# Patient Record
Sex: Female | Born: 1960 | Race: White | Hispanic: Yes | Marital: Single | Smoking: Current every day smoker
Health system: Southern US, Community
[De-identification: ages and names within clinical notes are randomized; demographics above are authoritative.]

## PROBLEM LIST (undated history)

## (undated) DIAGNOSIS — E7800 Pure hypercholesterolemia, unspecified: Secondary | ICD-10-CM

## (undated) DIAGNOSIS — E079 Disorder of thyroid, unspecified: Secondary | ICD-10-CM

## (undated) HISTORY — PX: ABDOMINAL HYSTERECTOMY: SHX81

## (undated) HISTORY — PX: TOTAL ABDOMINAL HYSTERECTOMY: SHX209

---

## 2020-12-31 ENCOUNTER — Other Ambulatory Visit: Payer: Self-pay

## 2020-12-31 ENCOUNTER — Emergency Department (HOSPITAL_COMMUNITY): Payer: 59

## 2020-12-31 ENCOUNTER — Encounter (HOSPITAL_COMMUNITY): Payer: Self-pay | Admitting: *Deleted

## 2020-12-31 ENCOUNTER — Observation Stay (HOSPITAL_COMMUNITY)
Admission: EM | Admit: 2020-12-31 | Discharge: 2021-01-03 | Disposition: A | Discharge status: Home / Self Care | Payer: 59 | Attending: Internal Medicine | Admitting: Internal Medicine

## 2020-12-31 DIAGNOSIS — F17200 Nicotine dependence, unspecified, uncomplicated: Secondary | ICD-10-CM | POA: Diagnosis not present

## 2020-12-31 DIAGNOSIS — N12 Tubulo-interstitial nephritis, not specified as acute or chronic: Secondary | ICD-10-CM | POA: Insufficient documentation

## 2020-12-31 DIAGNOSIS — M791 Myalgia, unspecified site: Secondary | ICD-10-CM | POA: Diagnosis not present

## 2020-12-31 DIAGNOSIS — E039 Hypothyroidism, unspecified: Secondary | ICD-10-CM | POA: Insufficient documentation

## 2020-12-31 DIAGNOSIS — A084 Viral intestinal infection, unspecified: Secondary | ICD-10-CM | POA: Diagnosis not present

## 2020-12-31 DIAGNOSIS — Z20822 Contact with and (suspected) exposure to covid-19: Secondary | ICD-10-CM | POA: Insufficient documentation

## 2020-12-31 DIAGNOSIS — R7401 Elevation of levels of liver transaminase levels: Secondary | ICD-10-CM | POA: Diagnosis not present

## 2020-12-31 DIAGNOSIS — R109 Unspecified abdominal pain: Secondary | ICD-10-CM | POA: Diagnosis present

## 2020-12-31 DIAGNOSIS — R509 Fever, unspecified: Secondary | ICD-10-CM | POA: Diagnosis present

## 2020-12-31 DIAGNOSIS — R112 Nausea with vomiting, unspecified: Secondary | ICD-10-CM | POA: Diagnosis present

## 2020-12-31 HISTORY — DX: Disorder of thyroid, unspecified: E07.9

## 2020-12-31 LAB — CBC WITH DIFFERENTIAL/PLATELET
Abs Immature Granulocytes: 0.01 10*3/uL (ref 0.00–0.07)
Basophils Absolute: 0 10*3/uL (ref 0.0–0.1)
Basophils Relative: 1 %
Eosinophils Absolute: 0 10*3/uL (ref 0.0–0.5)
Eosinophils Relative: 0 %
HCT: 45.4 % (ref 36.0–46.0)
Hemoglobin: 15.2 g/dL — ABNORMAL HIGH (ref 12.0–15.0)
Immature Granulocytes: 0 %
Lymphocytes Relative: 23 %
Lymphs Abs: 0.6 10*3/uL — ABNORMAL LOW (ref 0.7–4.0)
MCH: 31.5 pg (ref 26.0–34.0)
MCHC: 33.5 g/dL (ref 30.0–36.0)
MCV: 94.2 fL (ref 80.0–100.0)
Monocytes Absolute: 0.1 10*3/uL (ref 0.1–1.0)
Monocytes Relative: 3 %
Neutro Abs: 2 10*3/uL (ref 1.7–7.7)
Neutrophils Relative %: 73 %
Platelets: UNDETERMINED 10*3/uL (ref 150–400)
RBC: 4.82 MIL/uL (ref 3.87–5.11)
RDW: 13.6 % (ref 11.5–15.5)
WBC: 2.8 10*3/uL — ABNORMAL LOW (ref 4.0–10.5)
nRBC: 0 % (ref 0.0–0.2)

## 2020-12-31 LAB — PROTIME-INR
INR: 1 (ref 0.8–1.2)
Prothrombin Time: 13.2 seconds (ref 11.4–15.2)

## 2020-12-31 LAB — COMPREHENSIVE METABOLIC PANEL
ALT: 165 U/L — ABNORMAL HIGH (ref 0–44)
AST: 142 U/L — ABNORMAL HIGH (ref 15–41)
Albumin: 3.5 g/dL (ref 3.5–5.0)
Alkaline Phosphatase: 180 U/L — ABNORMAL HIGH (ref 38–126)
Anion gap: 10 (ref 5–15)
BUN: <5 mg/dL — ABNORMAL LOW (ref 6–20)
CO2: 26 mmol/L (ref 22–32)
Calcium: 8.5 mg/dL — ABNORMAL LOW (ref 8.9–10.3)
Chloride: 99 mmol/L (ref 98–111)
Creatinine, Ser: 0.83 mg/dL (ref 0.44–1.00)
GFR, Estimated: >60 mL/min (ref >60)
Glucose, Bld: 98 mg/dL (ref 70–99)
Potassium: 3.9 mmol/L (ref 3.5–5.1)
Sodium: 135 mmol/L (ref 135–145)
Total Bilirubin: 1.8 mg/dL — ABNORMAL HIGH (ref 0.3–1.2)
Total Protein: 6.5 g/dL (ref 6.5–8.1)

## 2020-12-31 LAB — URINALYSIS, ROUTINE W REFLEX MICROSCOPIC
Bilirubin Urine: NEGATIVE
Glucose, UA: NEGATIVE mg/dL
Ketones, ur: NEGATIVE mg/dL
Nitrite: POSITIVE — AB
Protein, ur: NEGATIVE mg/dL
Specific Gravity, Urine: 1.002 — ABNORMAL LOW (ref 1.005–1.030)
pH: 7 (ref 5.0–8.0)

## 2020-12-31 LAB — LACTIC ACID, PLASMA: Lactic Acid, Venous: 1.1 mmol/L (ref 0.5–1.9)

## 2020-12-31 LAB — RESP PANEL BY RT-PCR (FLU A&B, COVID) ARPGX2
Influenza A by PCR: NEGATIVE
Influenza B by PCR: NEGATIVE
SARS Coronavirus 2 by RT PCR: NEGATIVE

## 2020-12-31 LAB — APTT: aPTT: 28 seconds (ref 24–36)

## 2020-12-31 LAB — I-STAT BETA HCG BLOOD, ED (MC, WL, AP ONLY): I-stat hCG, quantitative: 10.1 m[IU]/mL — ABNORMAL HIGH (ref <5)

## 2020-12-31 MED ORDER — LACTATED RINGERS IV BOLUS (SEPSIS)
1000.0000 mL | Freq: Once | INTRAVENOUS | Status: AC
  Administered 2020-12-31: 1000 mL via INTRAVENOUS

## 2020-12-31 MED ORDER — ACETAMINOPHEN 325 MG PO TABS
650.0000 mg | ORAL_TABLET | Freq: Once | ORAL | Status: AC
  Administered 2020-12-31: 650 mg via ORAL
  Filled 2020-12-31: qty 2

## 2020-12-31 MED ORDER — IBUPROFEN 800 MG PO TABS
800.0000 mg | ORAL_TABLET | Freq: Once | ORAL | Status: AC
  Administered 2021-01-01: 800 mg via ORAL
  Filled 2020-12-31: qty 1

## 2020-12-31 MED ORDER — METRONIDAZOLE 500 MG/100ML IV SOLN
500.0000 mg | Freq: Once | INTRAVENOUS | Status: AC
  Administered 2020-12-31: 500 mg via INTRAVENOUS
  Filled 2020-12-31: qty 100

## 2020-12-31 MED ORDER — LACTATED RINGERS IV SOLN: INTRAVENOUS | Status: AC

## 2020-12-31 MED ORDER — SODIUM CHLORIDE 0.9 % IV SOLN
2.0000 g | Freq: Once | INTRAVENOUS | Status: AC
  Administered 2020-12-31: 2 g via INTRAVENOUS
  Filled 2020-12-31: qty 20

## 2020-12-31 MED ORDER — ONDANSETRON 4 MG PO TBDP
4.0000 mg | ORAL_TABLET | Freq: Once | ORAL | Status: AC
  Administered 2020-12-31: 4 mg via ORAL
  Filled 2020-12-31: qty 1

## 2020-12-31 MED ORDER — LACTATED RINGERS IV BOLUS (SEPSIS)
250.0000 mL | Freq: Once | INTRAVENOUS | Status: AC
  Administered 2020-12-31: 250 mL via INTRAVENOUS

## 2020-12-31 NOTE — ED Provider Notes (Signed)
[facility] Provider Note   CSN: [id number] Arrival date & time: 12/31/20  1648     History Chief Complaint  Patient presents with  . Fever    Natasha Roberts is a 60 y.o. female.  The history is provided by the patient. The history is limited by a language barrier. A language interpreter was used.  Fever    60 year old female presenting with cold symptoms.  Patient is Spanish-speaking, history obtained through language interpreter.  Patient report for the past 4 days she has had fever, chills, headache, abdominal pain, nausea, vomiting, some loose stools, back pain, body aches and overall not feeling well.  Symptoms are moderate in severity and not improved despite taking home medication including Tylenol and ibuprofen.  She endorsed occasional cough and some mild sore throat.  She endorsed decrease in appetite having been eating or drinking much.  She denies any urinary symptoms no shortness of breath no neck stiffness.  No recent sick contact.  Patient has been fully vaccinated for COVID-19.  Patient denies alcohol or tobacco abuse.  Past Medical History:  Diagnosis Date  . Thyroid disease     There are no problems to display for this patient.   History reviewed. No pertinent surgical history.   OB History   No obstetric history on file.     History reviewed. No pertinent family history.  Social History   Tobacco Use  . Smoking status: Current Some Day Smoker  Substance Use Topics  . Alcohol use: Never  . Drug use: Never    Home Medications Prior to Admission medications   Not on File    Allergies    Patient has no known allergies.  Review of Systems   Review of Systems  Constitutional: Positive for fever.  All other systems reviewed and are negative.   Physical Exam Updated Vital Signs BP (!) 165/101 (BP Location: Right Arm)   Pulse (!) 152   Temp (!) 103.2 F (39.6 C) (Oral)   Resp (!) 24   SpO2 96%    Physical Exam Vitals and nursing note reviewed.  Constitutional:      General: She is not in acute distress.    Appearance: She is well-developed.  HENT:     Head: Atraumatic.  Eyes:     Conjunctiva/sclera: Conjunctivae normal.  Neck:     Comments: No nuchal rigidity Cardiovascular:     Rate and Rhythm: Tachycardia present.     Pulses: Normal pulses.     Heart sounds: Normal heart sounds.  Pulmonary:     Effort: Pulmonary effort is normal.     Breath sounds: Normal breath sounds. No wheezing, rhonchi or rales.  Abdominal:     Palpations: Abdomen is soft.     Tenderness: There is abdominal tenderness (Tenderness left upper quadrant on palpation.  Negative Murphy sign, no pain at McBurney's point).  Musculoskeletal:     Cervical back: Neck supple. No rigidity.     Right lower leg: No edema.     Left lower leg: No edema.  Skin:    Findings: No rash.  Neurological:     Mental Status: She is alert and oriented to person, place, and time.     GCS: GCS eye subscore is 4. GCS verbal subscore is 5. GCS motor subscore is 6.     Cranial Nerves: Cranial nerves are intact.     Sensory: Sensation is intact.     Motor: Motor function is intact.  Psychiatric:        Mood and Affect: Mood normal.     ED Results / Procedures / Treatments   Labs (all labs ordered are listed, but only abnormal results are displayed) Labs Reviewed  COMPREHENSIVE METABOLIC PANEL - Abnormal; Notable for the following components:      Result Value   BUN <5 (*)    Calcium 8.5 (*)    AST 142 (*)    ALT 165 (*)    Alkaline Phosphatase 180 (*)    Total Bilirubin 1.8 (*)    All other components within normal limits  CBC WITH DIFFERENTIAL/PLATELET - Abnormal; Notable for the following components:   WBC 2.8 (*)    Hemoglobin 15.2 (*)    Lymphs Abs 0.6 (*)    All other components within normal limits  URINALYSIS, ROUTINE W REFLEX MICROSCOPIC - Abnormal; Notable for the following components:   Specific  Gravity, Urine 1.002 (*)    Hgb urine dipstick MODERATE (*)    Nitrite POSITIVE (*)    Leukocytes,Ua SMALL (*)    Bacteria, UA RARE (*)    All other components within normal limits  I-STAT BETA HCG BLOOD, ED ([facility], [facility], [facility] ONLY) - Abnormal; Notable for the following components:   I-stat hCG, quantitative 10.1 (*)    All other components within normal limits  RESP PANEL BY RT-PCR (FLU A&B, COVID) ARPGX2  CULTURE, BLOOD (ROUTINE X 2)  CULTURE, BLOOD (ROUTINE X 2)  URINE CULTURE  LACTIC ACID, PLASMA  PROTIME-INR  APTT  LACTIC ACID, PLASMA    EKG None  Radiology CT ABDOMEN PELVIS WO CONTRAST  Result Date: 12/31/2020 CLINICAL DATA:  Acute abdominal pain, nausea, and fever beginning today. EXAM: CT ABDOMEN AND PELVIS WITHOUT CONTRAST TECHNIQUE: Multidetector CT imaging of the abdomen and pelvis was performed following the standard protocol without IV contrast. COMPARISON:  None. FINDINGS: Lower chest: A few tiny pulmonary nodules are seen in the peripheral right middle lobe measuring up to 3 mm. Hepatobiliary: No mass visualized on this unenhanced exam. Gallbladder is unremarkable. No evidence of biliary ductal dilatation. Pancreas: No mass or inflammatory process visualized on this unenhanced exam. Spleen:  Within normal limits in size. Adrenals/Urinary tract: No evidence of urolithiasis or hydronephrosis. Unremarkable unopacified urinary bladder. Stomach/Bowel: No evidence of obstruction, inflammatory process, or abnormal fluid collections. Normal appendix visualized. Diverticulosis is seen predominately involving the right colon, however there is no evidence of diverticulitis. Vascular/Lymphatic: No pathologically enlarged lymph nodes identified. No evidence of abdominal aortic aneurysm. Reproductive: Prior hysterectomy noted. Adnexal regions are unremarkable in appearance. Other:  None. Musculoskeletal:  No suspicious bone lesions identified. IMPRESSION: No acute findings. Colonic diverticulosis,  without radiographic evidence of diverticulitis. Tiny right middle lobe pulmonary nodules, largest measuring 3 mm. No follow-up needed if patient is low-risk (and has no known or suspected primary neoplasm). Non-contrast chest CT can be considered in 12 months if patient is high-risk. This recommendation follows the consensus statement: Guidelines for Management of Incidental Pulmonary Nodules Detected on CT Images: From the Fleischner Society 2017; Radiology 2017; 284:228-243. Electronically Signed   By: Marlaine Hind M.D.   On: 12/31/2020 20:21   DG Chest Port 1 View  Result Date: 12/31/2020 CLINICAL DATA:  Questionable sepsis. EXAM: PORTABLE CHEST 1 VIEW COMPARISON:  None. FINDINGS: Multiple overlying radiopaque cardiac lead wires are noted. The heart size and mediastinal contours are within normal limits. Both lungs are clear. The visualized skeletal structures are unremarkable. IMPRESSION: No active disease. Electronically Signed  By: Virgina Norfolk M.D.   On: 12/31/2020 21:24    Procedures .Critical Care Performed by: Domenic Moras, PA-C Authorized by: Domenic Moras, PA-C   Critical care provider statement:    Critical care time (minutes):  40   Critical care was time spent personally by me on the following activities:  Discussions with consultants, evaluation of patient's response to treatment, examination of patient, ordering and performing treatments and interventions, ordering and review of laboratory studies, ordering and review of radiographic studies, pulse oximetry, re-evaluation of patient's condition, obtaining history from patient or surrogate and review of old charts     Medications Ordered in ED Medications  lactated ringers infusion (has no administration in time range)  ibuprofen (ADVIL) tablet 800 mg (has no administration in time range)  ondansetron (ZOFRAN-ODT) disintegrating tablet 4 mg (4 mg Oral Given 12/31/20 1705)  acetaminophen (TYLENOL) tablet 650 mg (650 mg Oral  Given 12/31/20 1704)  lactated ringers bolus 1,000 mL (0 mLs Intravenous Stopped 12/31/20 2257)    And  lactated ringers bolus 1,000 mL (0 mLs Intravenous Stopped 12/31/20 2257)    And  lactated ringers bolus 250 mL (250 mLs Intravenous New Bag/Given 12/31/20 2308)  cefTRIAXone (ROCEPHIN) 2 g in sodium chloride 0.9 % 100 mL IVPB (0 g Intravenous Stopped 12/31/20 2124)  metroNIDAZOLE (FLAGYL) IVPB 500 mg (0 mg Intravenous Stopped 12/31/20 2257)    ED Course  I have reviewed the triage vital signs and the nursing notes.  Pertinent labs & imaging results that were available during my care of the patient were reviewed by me and considered in my medical decision making (see chart for details).    MDM Rules/Calculators/A&P                          BP 135/83   Pulse (!) 120   Temp (!) 100.9 F (38.3 C) (Oral)   Resp 12   SpO2 94%   Final Clinical Impression(s) / ED Diagnoses Final diagnoses:  Pyelonephritis    Rx / DC Orders ED Discharge Orders    None     7:36 PM Patient here with fever chills body aches and upper abdominal pain.  Occasional cough.  Patient was found to be febrile with a temperature of 103.2 tachycardic with a heart rate of 152, and tachypneic with respiratory of 24.  Code sepsis initiated, since she has abdominal discomfort, will give Rocephin and metronidazole antibiotic as well as IV fluid.  COVID test today is negative.  10:07 PM Labs remarkable for transaminitis with AST 142, ALT 165, alk phos 180, total bili of 1.8.  Although patient does take Tylenol was not a significant amount suggest Tylenol induced transaminitis.  WBC is 2.80.  UA shows moderate hemoglobin and urine dipsticks as well as nitrite positive and small leukocyte.  COVID test negative.  Chest x-ray unremarkable.  An abdominal pelvis CT scan obtained showed no acute finding.  Evidence of diverticulosis without evidence of diverticulitis.  Incidentally a tiny right middle lobe pulmonary nodule  approximately 3 mm were noted.  However patient is at low risk for primary neoplasm therefore no follow-up is indicated.  In the setting of fever, tachycardia, possible UTI, patient will receive antibiotic.  Sepsis reassessment done.  At this time patient is not hypotensive.  Her temperature did improve after receiving Tylenol.  11:02 PM At this time patient reports she is feeling a bit better.  She denies nausea vomiting.  Patient from  that she does not take Tylenol regular basis.  Her transaminitis can be evaluated outpatient.  Patient voiced desire to go home.  Patient still is a bit tachycardic, will continue with fluid hydration and reassessment.  11:34 PM Patient resents that she can stay in the hospital for further treatment as she is still tachycardic however she is not hypotensive and she is not vomiting and request to be discharged.  She understands to return if her symptoms worsen.  Will discharge home with Bactrim as treatment for urinary tract infection.  On reassessment, she is still tachycardic and she felt she would prefer to stay in the hospital.  I felt this is the appropriate step, will consult for admission.  12:04 AM Pt sign out to oncoming team who will consult for admission.    Domenic Moras, PA-C 01/01/21 0006    Luna Fuse, MD 01/02/21 [phone]

## 2020-12-31 NOTE — ED Provider Notes (Incomplete)
[facility] Provider Note   CSN: [id number] Arrival date & time: 12/31/20  1648     History Chief Complaint  Patient presents with  . Fever    Natasha Roberts is a 60 y.o. female.  The history is provided by the patient. The history is limited by a language barrier. A language interpreter was used.  Fever    60 year old female presenting with cold symptoms.  Patient is Spanish-speaking, history obtained through language interpreter.  Patient report for the past 4 days she has had fever, chills, headache, abdominal pain, nausea, vomiting, some loose stools, back pain, body aches and overall not feeling well.  Symptoms are moderate in severity and not improved despite taking home medication including Tylenol and ibuprofen.  She endorsed occasional cough and some mild sore throat.  She endorsed decrease in appetite having been eating or drinking much.  She denies any urinary symptoms no shortness of breath no neck stiffness.  No recent sick contact.  Patient has been fully vaccinated for COVID-19.  Patient denies alcohol or tobacco abuse.  Past Medical History:  Diagnosis Date  . Thyroid disease     There are no problems to display for this patient.   History reviewed. No pertinent surgical history.   OB History   No obstetric history on file.     History reviewed. No pertinent family history.  Social History   Tobacco Use  . Smoking status: Current Some Day Smoker  Substance Use Topics  . Alcohol use: Never  . Drug use: Never    Home Medications Prior to Admission medications   Not on File    Allergies    Patient has no known allergies.  Review of Systems   Review of Systems  Constitutional: Positive for fever.  All other systems reviewed and are negative.   Physical Exam Updated Vital Signs BP (!) 165/101 (BP Location: Right Arm)   Pulse (!) 152   Temp (!) 103.2 F (39.6 C) (Oral)   Resp (!) 24   SpO2 96%    Physical Exam Vitals and nursing note reviewed.  Constitutional:      General: She is not in acute distress.    Appearance: She is well-developed.  HENT:     Head: Atraumatic.  Eyes:     Conjunctiva/sclera: Conjunctivae normal.  Neck:     Comments: No nuchal rigidity Cardiovascular:     Rate and Rhythm: Tachycardia present.     Pulses: Normal pulses.     Heart sounds: Normal heart sounds.  Pulmonary:     Effort: Pulmonary effort is normal.     Breath sounds: Normal breath sounds. No wheezing, rhonchi or rales.  Abdominal:     Palpations: Abdomen is soft.     Tenderness: There is abdominal tenderness (Tenderness left upper quadrant on palpation.  Negative Murphy sign, no pain at McBurney's point).  Musculoskeletal:     Cervical back: Neck supple. No rigidity.     Right lower leg: No edema.     Left lower leg: No edema.  Skin:    Findings: No rash.  Neurological:     Mental Status: She is alert and oriented to person, place, and time.     GCS: GCS eye subscore is 4. GCS verbal subscore is 5. GCS motor subscore is 6.     Cranial Nerves: Cranial nerves are intact.     Sensory: Sensation is intact.     Motor: Motor function is intact.  Psychiatric:        Mood and Affect: Mood normal.     ED Results / Procedures / Treatments   Labs (all labs ordered are listed, but only abnormal results are displayed) Labs Reviewed  COMPREHENSIVE METABOLIC PANEL - Abnormal; Notable for the following components:      Result Value   BUN <5 (*)    Calcium 8.5 (*)    AST 142 (*)    ALT 165 (*)    Alkaline Phosphatase 180 (*)    Total Bilirubin 1.8 (*)    All other components within normal limits  CBC WITH DIFFERENTIAL/PLATELET - Abnormal; Notable for the following components:   WBC 2.8 (*)    Hemoglobin 15.2 (*)    Lymphs Abs 0.6 (*)    All other components within normal limits  URINALYSIS, ROUTINE W REFLEX MICROSCOPIC - Abnormal; Notable for the following components:   Specific  Gravity, Urine 1.002 (*)    Hgb urine dipstick MODERATE (*)    Nitrite POSITIVE (*)    Leukocytes,Ua SMALL (*)    Bacteria, UA RARE (*)    All other components within normal limits  I-STAT BETA HCG BLOOD, ED ([facility], [facility], [facility] ONLY) - Abnormal; Notable for the following components:   I-stat hCG, quantitative 10.1 (*)    All other components within normal limits  RESP PANEL BY RT-PCR (FLU A&B, COVID) ARPGX2  CULTURE, BLOOD (ROUTINE X 2)  CULTURE, BLOOD (ROUTINE X 2)  URINE CULTURE  LACTIC ACID, PLASMA  PROTIME-INR  APTT  LACTIC ACID, PLASMA    EKG None  Radiology CT ABDOMEN PELVIS WO CONTRAST  Result Date: 12/31/2020 CLINICAL DATA:  Acute abdominal pain, nausea, and fever beginning today. EXAM: CT ABDOMEN AND PELVIS WITHOUT CONTRAST TECHNIQUE: Multidetector CT imaging of the abdomen and pelvis was performed following the standard protocol without IV contrast. COMPARISON:  None. FINDINGS: Lower chest: A few tiny pulmonary nodules are seen in the peripheral right middle lobe measuring up to 3 mm. Hepatobiliary: No mass visualized on this unenhanced exam. Gallbladder is unremarkable. No evidence of biliary ductal dilatation. Pancreas: No mass or inflammatory process visualized on this unenhanced exam. Spleen:  Within normal limits in size. Adrenals/Urinary tract: No evidence of urolithiasis or hydronephrosis. Unremarkable unopacified urinary bladder. Stomach/Bowel: No evidence of obstruction, inflammatory process, or abnormal fluid collections. Normal appendix visualized. Diverticulosis is seen predominately involving the right colon, however there is no evidence of diverticulitis. Vascular/Lymphatic: No pathologically enlarged lymph nodes identified. No evidence of abdominal aortic aneurysm. Reproductive: Prior hysterectomy noted. Adnexal regions are unremarkable in appearance. Other:  None. Musculoskeletal:  No suspicious bone lesions identified. IMPRESSION: No acute findings. Colonic diverticulosis,  without radiographic evidence of diverticulitis. Tiny right middle lobe pulmonary nodules, largest measuring 3 mm. No follow-up needed if patient is low-risk (and has no known or suspected primary neoplasm). Non-contrast chest CT can be considered in 12 months if patient is high-risk. This recommendation follows the consensus statement: Guidelines for Management of Incidental Pulmonary Nodules Detected on CT Images: From the Fleischner Society 2017; Radiology 2017; 284:228-243. Electronically Signed   By: Marlaine Hind M.D.   On: 12/31/2020 20:21   DG Chest Port 1 View  Result Date: 12/31/2020 CLINICAL DATA:  Questionable sepsis. EXAM: PORTABLE CHEST 1 VIEW COMPARISON:  None. FINDINGS: Multiple overlying radiopaque cardiac lead wires are noted. The heart size and mediastinal contours are within normal limits. Both lungs are clear. The visualized skeletal structures are unremarkable. IMPRESSION: No active disease. Electronically Signed  By: Aram Candela M.D.   On: 12/31/2020 21:24    Procedures .Critical Care Performed by: Fayrene Helper, PA-C Authorized by: Fayrene Helper, PA-C   Critical care provider statement:    Critical care time (minutes):  40   Critical care was time spent personally by me on the following activities:  Discussions with consultants, evaluation of patient's response to treatment, examination of patient, ordering and performing treatments and interventions, ordering and review of laboratory studies, ordering and review of radiographic studies, pulse oximetry, re-evaluation of patient's condition, obtaining history from patient or surrogate and review of old charts   {Remember to document critical care time when appropriate:1}  Medications Ordered in ED Medications  lactated ringers infusion (has no administration in time range)  ibuprofen (ADVIL) tablet 800 mg (has no administration in time range)  ondansetron (ZOFRAN-ODT) disintegrating tablet 4 mg (4 mg Oral Given 12/31/20  1705)  acetaminophen (TYLENOL) tablet 650 mg (650 mg Oral Given 12/31/20 1704)  lactated ringers bolus 1,000 mL (0 mLs Intravenous Stopped 12/31/20 2257)    And  lactated ringers bolus 1,000 mL (0 mLs Intravenous Stopped 12/31/20 2257)    And  lactated ringers bolus 250 mL (250 mLs Intravenous New Bag/Given 12/31/20 2308)  cefTRIAXone (ROCEPHIN) 2 g in sodium chloride 0.9 % 100 mL IVPB (0 g Intravenous Stopped 12/31/20 2124)  metroNIDAZOLE (FLAGYL) IVPB 500 mg (0 mg Intravenous Stopped 12/31/20 2257)    ED Course  I have reviewed the triage vital signs and the nursing notes.  Pertinent labs & imaging results that were available during my care of the patient were reviewed by me and considered in my medical decision making (see chart for details).    MDM Rules/Calculators/A&P                          BP 135/83   Pulse (!) 120   Temp (!) 100.9 F (38.3 C) (Oral)   Resp 12   SpO2 94%   Final Clinical Impression(s) / ED Diagnoses Final diagnoses:  None    Rx / DC Orders ED Discharge Orders    None     7:36 PM Patient here with fever chills body aches and upper abdominal pain.  Occasional cough.  Patient was found to be febrile with a temperature of 103.2 tachycardic with a heart rate of 152, and tachypneic with respiratory of 24.  Code sepsis initiated, since she has abdominal discomfort, will give Rocephin and metronidazole antibiotic as well as IV fluid.  COVID test today is negative.  10:07 PM Labs remarkable for transaminitis with AST 142, ALT 165, alk phos 180, total bili of 1.8.  Although patient does take Tylenol was not a significant amount suggest Tylenol induced transaminitis.  WBC is 2.80.  UA shows moderate hemoglobin and urine dipsticks as well as nitrite positive and small leukocyte.  COVID test negative.  Chest x-ray unremarkable.  An abdominal pelvis CT scan obtained showed no acute finding.  Evidence of diverticulosis without evidence of diverticulitis.  Incidentally a  tiny right middle lobe pulmonary nodule approximately 3 mm were noted.  However patient is at low risk for primary neoplasm therefore no follow-up is indicated.  In the setting of fever, tachycardia, possible UTI, patient will receive antibiotic.  Sepsis reassessment done.  At this time patient is not hypotensive.  Her temperature did improve after receiving Tylenol.  11:02 PM At this time patient reports she is feeling a bit better.  She denies nausea vomiting.  Patient from that she does not take Tylenol regular basis.  Her transaminitis can be evaluated outpatient.  Patient voiced desire to go home.  Patient still is a bit tachycardic, will continue with fluid hydration and reassessment.  11:34 PM Patient resents that she can stay in the hospital for further treatment as she is still tachycardic however she is not hypotensive and she is not vomiting and request to be discharged.  She understands to return if her symptoms worsen.  Will discharge home with Bactrim as treatment for urinary tract infection.  On reassessment, she is still tachycardic and she felt she would prefer to stay in the hospital.  I felt this is the appropriate step, will consult for admission.

## 2020-12-31 NOTE — ED Provider Notes (Signed)
Emergency Medicine Provider Triage Evaluation Note  Natasha Roberts , a 60 y.o. female  was evaluated in triage.  Pt complains of headaches, body aches, fever, nausea, onset 7AM today while at work, started feeling sick on Monday. Taking NyQuil and Tylenol, last had Tylenol this morning at 8AM.  No known exposure to COVID, did have COVID vaccines. No history of asthma or chronic lung disease. Occasional smoker.   Review of Systems  Positive: Fever, body aches, nausea Negative: vomiting  Physical Exam  There were no vitals taken for this visit. Gen:   Awake, no distress   Resp:  Normal effort  MSK:   Moves extremities without difficulty  Other:  Oropharynx erythematous without exudate  Medical Decision Making  Medically screening exam initiated at 4:53 PM.  Appropriate orders placed.  [name] was informed that the remainder of the evaluation will be completed by another provider, this initial triage assessment does not replace that evaluation, and the importance of remaining in the ED until their evaluation is complete.     Jeannie Fend, PA-C 12/31/20 1700    Koleen Distance, MD 12/31/20 2108

## 2020-12-31 NOTE — ED Triage Notes (Signed)
Interpretor used. Pt reports feeling bad since Monday morning with bodyaches and fever. Pt felt near syncopal today while at work. Temp 103.2at triage.

## 2020-12-31 NOTE — ED Notes (Signed)
Patient transported to CT 

## 2020-12-31 NOTE — Sepsis Progress Note (Signed)
Following for sepsis monitoring ?

## 2021-01-01 ENCOUNTER — Encounter (HOSPITAL_COMMUNITY): Payer: Self-pay | Admitting: Internal Medicine

## 2021-01-01 DIAGNOSIS — A084 Viral intestinal infection, unspecified: Secondary | ICD-10-CM | POA: Diagnosis not present

## 2021-01-01 DIAGNOSIS — M791 Myalgia, unspecified site: Secondary | ICD-10-CM | POA: Diagnosis not present

## 2021-01-01 DIAGNOSIS — R7401 Elevation of levels of liver transaminase levels: Secondary | ICD-10-CM | POA: Diagnosis not present

## 2021-01-01 DIAGNOSIS — R112 Nausea with vomiting, unspecified: Secondary | ICD-10-CM | POA: Diagnosis not present

## 2021-01-01 DIAGNOSIS — R509 Fever, unspecified: Secondary | ICD-10-CM | POA: Diagnosis present

## 2021-01-01 LAB — URINALYSIS, ROUTINE W REFLEX MICROSCOPIC
Bacteria, UA: NONE SEEN
Bilirubin Urine: NEGATIVE
Glucose, UA: NEGATIVE mg/dL
Ketones, ur: NEGATIVE mg/dL
Leukocytes,Ua: NEGATIVE
Nitrite: NEGATIVE
Protein, ur: NEGATIVE mg/dL
Specific Gravity, Urine: 1.003 — ABNORMAL LOW (ref 1.005–1.030)
pH: 9 — ABNORMAL HIGH (ref 5.0–8.0)

## 2021-01-01 LAB — LACTIC ACID, PLASMA: Lactic Acid, Venous: 1.5 mmol/L (ref 0.5–1.9)

## 2021-01-01 LAB — HIV ANTIBODY (ROUTINE TESTING W REFLEX): HIV Screen 4th Generation wRfx: NONREACTIVE

## 2021-01-01 LAB — TSH: TSH: 0.277 u[IU]/mL — ABNORMAL LOW (ref 0.350–4.500)

## 2021-01-01 MED ORDER — SODIUM CHLORIDE 0.9 % IV SOLN
1.0000 g | Freq: Every day | INTRAVENOUS | Status: DC
  Administered 2021-01-01 – 2021-01-02 (×2): 1 g via INTRAVENOUS
  Filled 2021-01-01: qty 1
  Filled 2021-01-01 (×2): qty 10

## 2021-01-01 MED ORDER — ACETAMINOPHEN 325 MG PO TABS
650.0000 mg | ORAL_TABLET | Freq: Four times a day (QID) | ORAL | Status: DC | PRN
  Administered 2021-01-01: 650 mg via ORAL
  Filled 2021-01-01 (×2): qty 2

## 2021-01-01 MED ORDER — TRAZODONE HCL 50 MG PO TABS
25.0000 mg | ORAL_TABLET | Freq: Every evening | ORAL | Status: DC | PRN
  Administered 2021-01-01: 25 mg via ORAL
  Filled 2021-01-01 (×2): qty 1

## 2021-01-01 MED ORDER — ONDANSETRON 4 MG PO TBDP
4.0000 mg | ORAL_TABLET | Freq: Three times a day (TID) | ORAL | Status: DC | PRN
  Administered 2021-01-01: 4 mg via ORAL
  Filled 2021-01-01: qty 1

## 2021-01-01 MED ORDER — ENOXAPARIN SODIUM 40 MG/0.4ML IJ SOSY
40.0000 mg | PREFILLED_SYRINGE | INTRAMUSCULAR | Status: DC
  Administered 2021-01-01 – 2021-01-02 (×2): 40 mg via SUBCUTANEOUS
  Filled 2021-01-01 (×2): qty 0.4

## 2021-01-01 MED ORDER — ACETAMINOPHEN 650 MG RE SUPP: 650.0000 mg | Freq: Four times a day (QID) | RECTAL | Status: DC | PRN

## 2021-01-01 MED ORDER — DEXTROSE-NACL 5-0.45 % IV SOLN: INTRAVENOUS | Status: DC

## 2021-01-01 NOTE — ED Notes (Signed)
Ambulated to bathroom , linens damp from sweating changed and warm blankets given. Patient comfortable.

## 2021-01-01 NOTE — Care Plan (Signed)
This 60 years old female with PMH significant for hypothyroidism presents in the ED with several day history of abdominal pain, nausea, vomiting, diffuse myalgia.  She also reports poor p.o. intake,  denies any urinary symptoms,  shortness of breath, neck stiffness.  In the ED she is found to have a temp of 103.2, Lactic acid 1.1, influenza negative, COVID-negative. CT abdomen and pelvis unremarkable.  There is mildly elevated liver enzymes.  Patient is admitted more gastroenteritis,  started on IV hydration.  Continue IV hydration,  adequate pain control.  UA shows small leukocytes. Patient was seen and examined.  Patient reports intermittent increased urinary frequency.  We will continue ceftriaxone,  follow-up urine and blood cultures.

## 2021-01-01 NOTE — ED Notes (Signed)
Pt given water, ok'd by Laveda Norman PA

## 2021-01-01 NOTE — ED Provider Notes (Signed)
23:55: Assumed care of patient from Fayrene Helper PA-C at change of shift pending consult for admission.   Please see prior provider note for full H&P.  Briefly patient is a 60 yo female who presented with fever, concern for pyelonephritis, meets SIRS criteria, receiving IV abx & fluids, remains tachycardic, plan is for admission.   00:27: CONSULT: Discussed with hospitalist Dr. Debby Bud- accepts admission.       Cherly Anderson, PA-C 01/01/21 0321    Gilda Crease, MD 01/01/21 [phone]

## 2021-01-01 NOTE — ED Notes (Signed)
Pt denies N/V/D after eating breakfast continues to deny abdominal pain at this time

## 2021-01-01 NOTE — H&P (Signed)
History and Physical    Natasha Roberts:[id number] DOB: [birth year] DOA: 12/31/2020  PCP: Pcp, No (Confirm with patient/family/NH records and if not entered, this has to be entered at [facility] point of entry) Patient coming from: home  I have personally briefly reviewed patient's old medical records in [place]  Chief Complaint: myalgias, abdominal pain, N/v  HPI: Natasha Roberts is a 60 y.o. female with medical history significant of hypothyroidism and 3 uncomplicated deliveries presents with a several day h/o abdominal pain, N/V, diffuse myalgias. She says she felt like she was sick unto death. Her PO intake has been limitd. She denies Urinary symptoms, SOB, neck stiffness. She denies sick contacts. She has been fully vaccinated against Covid 19. Due to persistent symptoms she presents to [facility] -ED for evaluation.   ED Course: Tmax 103.2, at exam T 99.4,  ED-PA exam notable for tachycardia, diffuse abdominal tenderness. Lab with lactic acid #1 1.1, #2 1.5, WBC 2.8 w/ Diff 73/23/3, Hgb 15.2, AST 142, ALT 165, Alk phos 180, U/A LE small, nitrite positive, 0-5 WBC/hpf, 0-5 RBC/hpf, few bacteria. CT abd/pelvis unremarkable. Due to tachycardia, fever, transaminitis codes sepsis initiated. Pt received 2L NS bolus, Rocephin 1g, Flagyl. Patient felt improved but due to persistent tachycardia and abdominal pain [facility] called to admit for continued treatment.   Review of Systems: As per HPI otherwise 10 point review of systems negative.    Past Medical History:  Diagnosis Date  . Thyroid disease hypothyroidism    History reviewed. No pertinent surgical history.  Soc Hx- divorced mother of 3 children ranging in age from 40 to 70 with youngest still living at home. 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   reports that she has been smoking. She does not have any smokeless tobacco history on file. She reports that she does not drink alcohol and does not use drugs. She does drink occasionally  No Known  Allergies  History reviewed. No pertinent family history.   Prior to Admission medications   Not on File    Physical Exam: Vitals:   01/01/21 0020 01/01/21 0020 01/01/21 0036 01/01/21 0115  BP: (!) 154/89  (!) 145/91 127/77  Pulse: (!) 116  (!) 108 (!) 107  Resp: (!) 29  (!) 24 (!) 21  Temp:  99.4 F (37.4 C)    TempSrc:  Oral    SpO2: 97%  96% 94%  Weight:  54.4 kg    Height:  '5\' 1"'  (1.549 m)       Vitals:   01/01/21 0020 01/01/21 0020 01/01/21 0036 01/01/21 0115  BP: (!) 154/89  (!) 145/91 127/77  Pulse: (!) 116  (!) 108 (!) 107  Resp: (!) 29  (!) 24 (!) 21  Temp:  99.4 F (37.4 C)    TempSrc:  Oral    SpO2: 97%  96% 94%  Weight:  54.4 kg    Height:  '5\' 1"'  (1.549 m)     General: WNWD woman who can speak enough english to communicate.Eyes: PERRL, lids and conjunctivae normal ENMT: Mucous membranes are moist. Posterior pharynx clear of any exudate or lesions.Normal dentition.  Neck: normal, supple, no masses, no thyromegaly Respiratory: clear to auscultation bilaterally, no wheezing, no crackles. Normal respiratory effort. No accessory muscle use.  Cardiovascular: Regular tachycardia , no murmurs / rubs / gallops. No extremity edema. 2+ pedal pulses. No carotid bruits.  Abdomen:  Bowel sounds positive. No HSM. Diffuse tenderness to palpation and percussion worse in the lower quadrants,  no masses, mild rebound, no guarding. Musculoskeletal: no clubbing / cyanosis. No joint deformity upper and lower extremities. Good ROM, no contractures. Normal muscle tone.  Skin: no rashes, lesions, ulcers. No induration Neurologic: CN 2-12 grossly intact. Sensation intact, DTR normal. Strength 5/5 in all 4.  Psychiatric: Normal judgment and insight. Alert and oriented x 3. Normal mood.     Labs on Admission: I have personally reviewed following labs and imaging studies  CBC: Recent Labs  Lab 12/31/20 2009  WBC 2.8*  NEUTROABS 2.0  HGB 15.2*  HCT 45.4  MCV 94.2  PLT  PLATELET CLUMPS NOTED ON SMEAR, UNABLE TO ESTIMATE   Basic Metabolic Panel: Recent Labs  Lab 12/31/20 2009  NA 135  K 3.9  CL 99  CO2 26  GLUCOSE 98  BUN <5*  CREATININE 0.83  CALCIUM 8.5*   GFR: Estimated Creatinine Clearance: 55.8 mL/min (by C-G formula based on SCr of 0.83 mg/dL). Liver Function Tests: Recent Labs  Lab 12/31/20 2009  AST 142*  ALT 165*  ALKPHOS 180*  BILITOT 1.8*  PROT 6.5  ALBUMIN 3.5   No results for input(s): LIPASE, AMYLASE in the last 168 hours. No results for input(s): AMMONIA in the last 168 hours. Coagulation Profile: Recent Labs  Lab 12/31/20 2009  INR 1.0   Cardiac Enzymes: No results for input(s): CKTOTAL, CKMB, CKMBINDEX, TROPONINI in the last 168 hours. BNP (last 3 results) No results for input(s): PROBNP in the last 8760 hours. HbA1C: No results for input(s): HGBA1C in the last 72 hours. CBG: No results for input(s): GLUCAP in the last 168 hours. Lipid Profile: No results for input(s): CHOL, HDL, LDLCALC, TRIG, CHOLHDL, LDLDIRECT in the last 72 hours. Thyroid Function Tests: No results for input(s): TSH, T4TOTAL, FREET4, T3FREE, THYROIDAB in the last 72 hours. Anemia Panel: No results for input(s): VITAMINB12, FOLATE, FERRITIN, TIBC, IRON, RETICCTPCT in the last 72 hours. Urine analysis:    Component Value Date/Time   COLORURINE YELLOW 12/31/2020 1933   APPEARANCEUR CLEAR 12/31/2020 1933   LABSPEC 1.002 (L) 12/31/2020 1933   PHURINE 7.0 12/31/2020 1933   GLUCOSEU NEGATIVE 12/31/2020 [place] (A) 12/31/2020 [place] 12/31/2020 [place] 12/31/2020 1933   PROTEINUR NEGATIVE 12/31/2020 1933   NITRITE POSITIVE (A) 12/31/2020 1933   LEUKOCYTESUR SMALL (A) 12/31/2020 1933    Radiological Exams on Admission: CT ABDOMEN PELVIS WO CONTRAST  Result Date: 12/31/2020 CLINICAL DATA:  Acute abdominal pain, nausea, and fever beginning today. EXAM: CT ABDOMEN AND PELVIS WITHOUT  CONTRAST TECHNIQUE: Multidetector CT imaging of the abdomen and pelvis was performed following the standard protocol without IV contrast. COMPARISON:  None. FINDINGS: Lower chest: A few tiny pulmonary nodules are seen in the peripheral right middle lobe measuring up to 3 mm. Hepatobiliary: No mass visualized on this unenhanced exam. Gallbladder is unremarkable. No evidence of biliary ductal dilatation. Pancreas: No mass or inflammatory process visualized on this unenhanced exam. Spleen:  Within normal limits in size. Adrenals/Urinary tract: No evidence of urolithiasis or hydronephrosis. Unremarkable unopacified urinary bladder. Stomach/Bowel: No evidence of obstruction, inflammatory process, or abnormal fluid collections. Normal appendix visualized. Diverticulosis is seen predominately involving the right colon, however there is no evidence of diverticulitis. Vascular/Lymphatic: No pathologically enlarged lymph nodes identified. No evidence of abdominal aortic aneurysm. Reproductive: Prior hysterectomy noted. Adnexal regions are unremarkable in appearance. Other:  None. Musculoskeletal:  No suspicious bone lesions identified. IMPRESSION: No acute findings. Colonic diverticulosis, without radiographic evidence  of diverticulitis. Tiny right middle lobe pulmonary nodules, largest measuring 3 mm. No follow-up needed if patient is low-risk (and has no known or suspected primary neoplasm). Non-contrast chest CT can be considered in 12 months if patient is high-risk. This recommendation follows the consensus statement: Guidelines for Management of Incidental Pulmonary Nodules Detected on CT Images: From the Fleischner Society 2017; Radiology 2017; 284:228-243. Electronically Signed   By: Marlaine Hind M.D.   On: 12/31/2020 20:21   DG Chest Port 1 View  Result Date: 12/31/2020 CLINICAL DATA:  Questionable sepsis. EXAM: PORTABLE CHEST 1 VIEW COMPARISON:  None. FINDINGS: Multiple overlying radiopaque cardiac lead wires are  noted. The heart size and mediastinal contours are within normal limits. Both lungs are clear. The visualized skeletal structures are unremarkable. IMPRESSION: No active disease. Electronically Signed   By: Virgina Norfolk M.D.   On: 12/31/2020 21:24    EKG: Independently reviewed. Sinus Tachycardia, RBBB, no acute changes  Assessment/Plan Active Problems:   Fever   N&V (nausea and vomiting)   Myalgia   Transaminitis   Viral gastroenteritis     1. Viral gastroenteritis - negative U/A, low WBC but nl diff, negative CT abd/pelvis but tender on exm with mild transminitis strongly suggestive of viral gastroenteritis as cause of fever, N/V, myalgias. Plan Med-surg observation  Full liquid diet  APAP for fever, myalgias  No indication for continue abx - no source of infection  Continue D5 1/2 NS hydration  2. Sepsis - initial symptoms as above with rapid response to fluids and APAP. Lactic acid 1.1 to 1.5. No indication of on-going sepsis. See #1 for plan  DVT prophylaxis: lovenox  Code Status: full code  Family Communication: no emergency contact listed  Disposition Plan: home 24-48 hrs  Consults called: none  Admission status: observation    Adella Hare MD [facility] Pager [phone]  If 7PM-7AM, please contact night-coverage [email or web address] Password [email or web address]  01/01/2021, 1:54 AM

## 2021-01-02 DIAGNOSIS — R509 Fever, unspecified: Secondary | ICD-10-CM | POA: Diagnosis not present

## 2021-01-02 LAB — CBC
HCT: 41.6 % (ref 36.0–46.0)
Hemoglobin: 14.5 g/dL (ref 12.0–15.0)
MCH: 31.8 pg (ref 26.0–34.0)
MCHC: 34.9 g/dL (ref 30.0–36.0)
MCV: 91.2 fL (ref 80.0–100.0)
Platelets: 84 10*3/uL — ABNORMAL LOW (ref 150–400)
RBC: 4.56 MIL/uL (ref 3.87–5.11)
RDW: 13.6 % (ref 11.5–15.5)
WBC: 3.8 10*3/uL — ABNORMAL LOW (ref 4.0–10.5)
nRBC: 0 % (ref 0.0–0.2)

## 2021-01-02 LAB — BASIC METABOLIC PANEL
Anion gap: 8 (ref 5–15)
BUN: <5 mg/dL — ABNORMAL LOW (ref 6–20)
CO2: 25 mmol/L (ref 22–32)
Calcium: 8.1 mg/dL — ABNORMAL LOW (ref 8.9–10.3)
Chloride: 103 mmol/L (ref 98–111)
Creatinine, Ser: 0.7 mg/dL (ref 0.44–1.00)
GFR, Estimated: >60 mL/min (ref >60)
Glucose, Bld: 122 mg/dL — ABNORMAL HIGH (ref 70–99)
Potassium: 3.1 mmol/L — ABNORMAL LOW (ref 3.5–5.1)
Sodium: 136 mmol/L (ref 135–145)

## 2021-01-02 LAB — MAGNESIUM: Magnesium: 2 mg/dL (ref 1.7–2.4)

## 2021-01-02 LAB — PHOSPHORUS: Phosphorus: 3.9 mg/dL (ref 2.5–4.6)

## 2021-01-02 MED ORDER — POTASSIUM CHLORIDE 20 MEQ PO PACK
40.0000 meq | PACK | Freq: Once | ORAL | Status: AC
  Administered 2021-01-02: 40 meq via ORAL
  Filled 2021-01-02: qty 2

## 2021-01-02 NOTE — Progress Notes (Signed)
PROGRESS NOTE    Natasha Roberts  HYQ:[id number] DOB: November 04, 1960 DOA: 12/31/2020 PCP: Pcp, No   Brief Narrative:  This 60 years old female with PMH significant for hypothyroidism presents in the ED with several days history of abdominal pain, nausea, vomiting, diffuse myalgia.  She also reports poor p.o. intake,  denies any urinary symptoms,  shortness of breath, neck stiffness.  In the ED she is found to have a temp of 103.2, Lactic acid 1.1, influenza negative, COVID-negative. CT abdomen and pelvis unremarkable.  There is mildly elevated liver enzymes.  Patient is admitted for dehydration sec. to gastroenteritis,  started on IV hydration.  adequate pain control.  UA shows small leukocytes. Patient reports intermittent increased urinary frequency.   Patient spiked fever last night, continued on ceftriaxone,  follow blood cultures and urine cultures.  Assessment & Plan:   Active Problems:   Fever   N&V (nausea and vomiting)   Myalgia   Transaminitis   Viral gastroenteritis   Fever could be Viral gastroenteritis: Patient presented with abdominal pain, nausea,  vomiting and myalgia. She was febrile in the ED with a fever of 103.4. Influenza negative, COVID-negative. Chest x-ray no acute abnormality.  UA shows small leukocytes. Patient was given ceftriaxone x1 in the ED. Lactic acid 1.1, patient was continued on IV hydration. Started on clear liquid diet,  advance to full liquids. CT abdomen and pelvis unremarkable. Patient is spiked fever 101.4 F last night. She is continued on ceftriaxone. Blood cultures no growth so far, Urine culture grew 20 K Klebsiella pneumonia and 10K staph hemolyticus.  Hypothyroidism: Continue levothyroxine.  Elevated liver enzymes: Continue supportive care.  Recheck labs.   DVT prophylaxis: Lovenox Code Status: Full code Family Communication: No family at bedside Disposition Plan:   Status is: Observation  The patient remains OBS appropriate and  will d/c before 2 midnights.  Dispo: The patient is from: Home              Anticipated d/c is to: Home              Patient currently is not medically stable to d/c.   Difficult to place patient No  Consultants:   None  Procedures:  Antimicrobials:  Anti-infectives (From admission, onward)   Start     Dose/Rate Route Frequency Ordered Stop   01/01/21 0815  cefTRIAXone (ROCEPHIN) 1 g in sodium chloride 0.9 % 100 mL IVPB        1 g 200 mL/hr over 30 Minutes Intravenous Daily 01/01/21 0810     12/31/20 1945  cefTRIAXone (ROCEPHIN) 2 g in sodium chloride 0.9 % 100 mL IVPB        2 g 200 mL/hr over 30 Minutes Intravenous  Once 12/31/20 1934 12/31/20 2124   12/31/20 1945  metroNIDAZOLE (FLAGYL) IVPB 500 mg        500 mg 100 mL/hr over 60 Minutes Intravenous  Once 12/31/20 1934 12/31/20 2257      Subjective: Patient was seen and examined at bedside.  Overnight events noted.  Patient spiked fever of 101.4 last night. She denies any chest pain but still complains of urinary discomfort but denies any frequency or pain.  Objective: Vitals:   01/01/21 2223 01/02/21 0453 01/02/21 0959 01/02/21 1452  BP:  110/76  114/75  Pulse:  91  80  Resp:  18  17  Temp: 98.6 F (37 C) 98.7 F (37.1 C) 98.7 F (37.1 C) 97.6 F (36.4 C)  TempSrc: Oral Oral  Oral Oral  SpO2:  95%  96%  Weight:      Height:        Intake/Output Summary (Last 24 hours) at 01/02/2021 1611 Last data filed at 01/02/2021 1246 Gross per 24 hour  Intake 1035 ml  Output --  Net 1035 ml   Filed Weights   01/01/21 0020  Weight: 54.4 kg    Examination:  General exam: Appears calm and comfortable, not in any acute distress. Respiratory system: Clear to auscultation. Respiratory effort normal. Cardiovascular system: S1 & S2 heard, RRR. No JVD, murmurs, rubs, gallops or clicks. No pedal edema. Gastrointestinal system: Abdomen is nondistended, soft and nontender. No organomegaly or masses felt. Normal bowel sounds  heard. Central nervous system: Alert and oriented. No focal neurological deficits. Extremities: Symmetric 5 x 5 power.  No edema, no cyanosis, no clubbing. Skin: No rashes, lesions or ulcers Psychiatry: Judgement and insight appear normal. Mood & affect appropriate.     Data Reviewed: I have personally reviewed following labs and imaging studies  CBC: Recent Labs  Lab 12/31/20 2009 01/02/21 0115  WBC 2.8* 3.8*  NEUTROABS 2.0  --   HGB 15.2* 14.5  HCT 45.4 41.6  MCV 94.2 91.2  PLT PLATELET CLUMPS NOTED ON SMEAR, UNABLE TO ESTIMATE 84*   Basic Metabolic Panel: Recent Labs  Lab 12/31/20 2009 01/02/21 0115  NA 135 136  K 3.9 3.1*  CL 99 103  CO2 26 25  GLUCOSE 98 122*  BUN <5* <5*  CREATININE 0.83 0.70  CALCIUM 8.5* 8.1*  MG  --  2.0  PHOS  --  3.9   GFR: Estimated Creatinine Clearance: 57.8 mL/min (by C-G formula based on SCr of 0.7 mg/dL). Liver Function Tests: Recent Labs  Lab 12/31/20 2009  AST 142*  ALT 165*  ALKPHOS 180*  BILITOT 1.8*  PROT 6.5  ALBUMIN 3.5   No results for input(s): LIPASE, AMYLASE in the last 168 hours. No results for input(s): AMMONIA in the last 168 hours. Coagulation Profile: Recent Labs  Lab 12/31/20 2009  INR 1.0   Cardiac Enzymes: No results for input(s): CKTOTAL, CKMB, CKMBINDEX, TROPONINI in the last 168 hours. BNP (last 3 results) No results for input(s): PROBNP in the last 8760 hours. HbA1C: No results for input(s): HGBA1C in the last 72 hours. CBG: No results for input(s): GLUCAP in the last 168 hours. Lipid Profile: No results for input(s): CHOL, HDL, LDLCALC, TRIG, CHOLHDL, LDLDIRECT in the last 72 hours. Thyroid Function Tests: Recent Labs    01/01/21 0923  TSH 0.277*   Anemia Panel: No results for input(s): VITAMINB12, FOLATE, FERRITIN, TIBC, IRON, RETICCTPCT in the last 72 hours. Sepsis Labs: Recent Labs  Lab 12/31/20 2009 01/01/21 0033  LATICACIDVEN 1.1 1.5    Recent Results (from the past 240  hour(s))  Resp Panel by RT-PCR (Flu A&B, Covid) Nasopharyngeal Swab     Status: None   Collection Time: 12/31/20  5:01 PM   Specimen: Nasopharyngeal Swab; Nasopharyngeal(NP) swabs in vial transport medium  Result Value Ref Range Status   SARS Coronavirus 2 by RT PCR NEGATIVE NEGATIVE Final    Comment: (NOTE) SARS-CoV-2 target nucleic acids are NOT DETECTED.  The SARS-CoV-2 RNA is generally detectable in upper respiratory specimens during the acute phase of infection. The lowest concentration of SARS-CoV-2 viral copies this assay can detect is 138 copies/mL. A negative result does not preclude SARS-Cov-2 infection and should not be used as the sole basis for treatment or other patient management  decisions. A negative result may occur with  improper specimen collection/handling, submission of specimen other than nasopharyngeal swab, presence of viral mutation(s) within the areas targeted by this assay, and inadequate number of viral copies(<138 copies/mL). A negative result must be combined with clinical observations, patient history, and epidemiological information. The expected result is Negative.  Fact Sheet for Patients:  BloggerCourse.comhttps://www.fda.gov/media/152166/download  Fact Sheet for Healthcare Providers:  SeriousBroker.ithttps://www.fda.gov/media/152162/download  This test is no t yet approved or cleared by the [place] FDA and  has been authorized for detection and/or diagnosis of SARS-CoV-2 by FDA under an Emergency Use Authorization (EUA). This EUA will remain  in effect (meaning this test can be used) for the duration of the COVID-19 declaration under Section 564(b)(1) of the Act, 21 U.S.C.section 360bbb-3(b)(1), unless the authorization is terminated  or revoked sooner.       Influenza A by PCR NEGATIVE NEGATIVE Final   Influenza B by PCR NEGATIVE NEGATIVE Final    Comment: (NOTE) The Xpert Xpress SARS-CoV-2/FLU/RSV plus assay is intended as an aid in the diagnosis of influenza from  Nasopharyngeal swab specimens and should not be used as a sole basis for treatment. Nasal washings and aspirates are unacceptable for Xpert Xpress SARS-CoV-2/FLU/RSV testing.  Fact Sheet for Patients: BloggerCourse.comhttps://www.fda.gov/media/152166/download  Fact Sheet for Healthcare Providers: SeriousBroker.ithttps://www.fda.gov/media/152162/download  This test is not yet approved or cleared by the [place] FDA and has been authorized for detection and/or diagnosis of SARS-CoV-2 by FDA under an Emergency Use Authorization (EUA). This EUA will remain in effect (meaning this test can be used) for the duration of the COVID-19 declaration under Section 564(b)(1) of the Act, 21 U.S.C. section 360bbb-3(b)(1), unless the authorization is terminated or revoked.  Performed at [facility], [address] [place], [place] [address]   Urine culture     Status: Abnormal (Preliminary result)   Collection Time: 12/31/20  7:33 PM   Specimen: In/Out Cath Urine  Result Value Ref Range Status   Specimen Description IN/OUT CATH URINE  Final   Special Requests NONE  Final   Culture (A)  Final    20,000 COLONIES/mL KLEBSIELLA PNEUMONIAE 10,000 COLONIES/mL STAPHYLOCOCCUS HAEMOLYTICUS CULTURE REINCUBATED FOR BETTER GROWTH Performed at [facility], [address] [place], [place] [address]    Report Status PENDING  Incomplete  Blood Culture (routine x 2)     Status: None (Preliminary result)   Collection Time: 12/31/20  8:09 PM   Specimen: BLOOD  Result Value Ref Range Status   Specimen Description BLOOD RIGHT ANTECUBITAL  Final   Special Requests   Final    BOTTLES DRAWN AEROBIC AND ANAEROBIC Blood Culture results may not be optimal due to an inadequate volume of blood received in culture bottles   Culture   Final    NO GROWTH 2 DAYS Performed at [facility], [address] [place], [place] [address]    Report Status PENDING  Incomplete  Blood Culture (routine x 2)     Status: None (Preliminary  result)   Collection Time: 12/31/20  8:14 PM   Specimen: BLOOD RIGHT HAND  Result Value Ref Range Status   Specimen Description BLOOD RIGHT HAND  Final   Special Requests   Final    BOTTLES DRAWN AEROBIC AND ANAEROBIC Blood Culture results may not be optimal due to an inadequate volume of blood received in culture bottles   Culture   Final    NO GROWTH 2 DAYS Performed at [facility], [address], [place], [place] [address]    Report Status PENDING  Incomplete    Radiology Studies: CT ABDOMEN PELVIS WO CONTRAST  Result Date: 12/31/2020 CLINICAL DATA:  Acute abdominal pain, nausea, and fever beginning today. EXAM: CT ABDOMEN AND PELVIS WITHOUT CONTRAST TECHNIQUE: Multidetector CT imaging of the abdomen and pelvis was performed following the standard protocol without IV contrast. COMPARISON:  None. FINDINGS: Lower chest: A few tiny pulmonary nodules are seen in the peripheral right middle lobe measuring up to 3 mm. Hepatobiliary: No mass visualized on this unenhanced exam. Gallbladder is unremarkable. No evidence of biliary ductal dilatation. Pancreas: No mass or inflammatory process visualized on this unenhanced exam. Spleen:  Within normal limits in size. Adrenals/Urinary tract: No evidence of urolithiasis or hydronephrosis. Unremarkable unopacified urinary bladder. Stomach/Bowel: No evidence of obstruction, inflammatory process, or abnormal fluid collections. Normal appendix visualized. Diverticulosis is seen predominately involving the right colon, however there is no evidence of diverticulitis. Vascular/Lymphatic: No pathologically enlarged lymph nodes identified. No evidence of abdominal aortic aneurysm. Reproductive: Prior hysterectomy noted. Adnexal regions are unremarkable in appearance. Other:  None. Musculoskeletal:  No suspicious bone lesions identified. IMPRESSION: No acute findings. Colonic diverticulosis, without radiographic evidence of diverticulitis. Tiny right middle lobe  pulmonary nodules, largest measuring 3 mm. No follow-up needed if patient is low-risk (and has no known or suspected primary neoplasm). Non-contrast chest CT can be considered in 12 months if patient is high-risk. This recommendation follows the consensus statement: Guidelines for Management of Incidental Pulmonary Nodules Detected on CT Images: From the Fleischner Society 2017; Radiology 2017; 284:228-243. Electronically Signed   By: Danae Orleans M.D.   On: 12/31/2020 20:21   DG Chest Port 1 View  Result Date: 12/31/2020 CLINICAL DATA:  Questionable sepsis. EXAM: PORTABLE CHEST 1 VIEW COMPARISON:  None. FINDINGS: Multiple overlying radiopaque cardiac lead wires are noted. The heart size and mediastinal contours are within normal limits. Both lungs are clear. The visualized skeletal structures are unremarkable. IMPRESSION: No active disease. Electronically Signed   By: Aram Candela M.D.   On: 12/31/2020 21:24    Scheduled Meds: . enoxaparin (LOVENOX) injection  40 mg Subcutaneous Q24H   Continuous Infusions: . cefTRIAXone (ROCEPHIN)  IV 1 g (01/02/21 1009)  . dextrose 5 % and 0.45% NaCl 75 mL/hr at 01/02/21 0900     LOS: 0 days    Time spent: 25 mins    Haizley Cannella, MD [facility]   If 7PM-7AM, please contact night-coverage

## 2021-01-03 DIAGNOSIS — R509 Fever, unspecified: Secondary | ICD-10-CM | POA: Diagnosis not present

## 2021-01-03 DIAGNOSIS — R112 Nausea with vomiting, unspecified: Secondary | ICD-10-CM | POA: Diagnosis not present

## 2021-01-03 DIAGNOSIS — M791 Myalgia, unspecified site: Secondary | ICD-10-CM | POA: Diagnosis not present

## 2021-01-03 DIAGNOSIS — R7401 Elevation of levels of liver transaminase levels: Secondary | ICD-10-CM | POA: Diagnosis not present

## 2021-01-03 LAB — COMPREHENSIVE METABOLIC PANEL
ALT: 132 U/L — ABNORMAL HIGH (ref 0–44)
AST: 111 U/L — ABNORMAL HIGH (ref 15–41)
Albumin: 2.8 g/dL — ABNORMAL LOW (ref 3.5–5.0)
Alkaline Phosphatase: 211 U/L — ABNORMAL HIGH (ref 38–126)
Anion gap: 10 (ref 5–15)
BUN: <5 mg/dL — ABNORMAL LOW (ref 6–20)
CO2: 21 mmol/L — ABNORMAL LOW (ref 22–32)
Calcium: 8.1 mg/dL — ABNORMAL LOW (ref 8.9–10.3)
Chloride: 108 mmol/L (ref 98–111)
Creatinine, Ser: 0.54 mg/dL (ref 0.44–1.00)
GFR, Estimated: >60 mL/min (ref >60)
Glucose, Bld: 104 mg/dL — ABNORMAL HIGH (ref 70–99)
Potassium: 3.7 mmol/L (ref 3.5–5.1)
Sodium: 139 mmol/L (ref 135–145)
Total Bilirubin: 0.7 mg/dL (ref 0.3–1.2)
Total Protein: 5.6 g/dL — ABNORMAL LOW (ref 6.5–8.1)

## 2021-01-03 LAB — CBC
HCT: 41.1 % (ref 36.0–46.0)
Hemoglobin: 14.1 g/dL (ref 12.0–15.0)
MCH: 31.8 pg (ref 26.0–34.0)
MCHC: 34.3 g/dL (ref 30.0–36.0)
MCV: 92.8 fL (ref 80.0–100.0)
Platelets: 103 10*3/uL — ABNORMAL LOW (ref 150–400)
RBC: 4.43 MIL/uL (ref 3.87–5.11)
RDW: 14 % (ref 11.5–15.5)
WBC: 6.1 10*3/uL (ref 4.0–10.5)
nRBC: 0 % (ref 0.0–0.2)

## 2021-01-03 LAB — MAGNESIUM: Magnesium: 2.2 mg/dL (ref 1.7–2.4)

## 2021-01-03 LAB — PHOSPHORUS: Phosphorus: 3.2 mg/dL (ref 2.5–4.6)

## 2021-01-03 MED ORDER — ACETAMINOPHEN 325 MG PO TABS: 650.0000 mg | ORAL_TABLET | Freq: Four times a day (QID) | ORAL | Status: DC | PRN

## 2021-01-03 NOTE — Progress Notes (Signed)
Daughter in to take patient home.  Daughter had questions re:  Test results, which will soon be available via 'My Chart'.  Information provided re:  Access code to website.  Questions answered to patient's satisfaction. Escorted to exit via wheelchair accompanied by this nurse.  It was a pleasure caring for this patient.

## 2021-01-03 NOTE — Progress Notes (Signed)
Patient informed of change in diet to regular.  States feels hungry.  No nausea noted.

## 2021-01-03 NOTE — Plan of Care (Signed)
  Problem: Education: Goal: Knowledge of General Education information will improve Description: Including pain rating scale, medication(s)/side effects and non-pharmacologic comfort measures Outcome: Adequate for Discharge   Problem: Health Behavior/Discharge Planning: Goal: Ability to manage health-related needs will improve Outcome: Adequate for Discharge   Problem: Clinical Measurements: Goal: Ability to maintain clinical measurements within normal limits will improve Outcome: Adequate for Discharge Goal: Will remain free from infection Outcome: Adequate for Discharge Goal: Diagnostic test results will improve Outcome: Adequate for Discharge Goal: Respiratory complications will improve Outcome: Adequate for Discharge Goal: Cardiovascular complication will be avoided Outcome: Adequate for Discharge   Problem: Activity: Goal: Risk for activity intolerance will decrease Outcome: Adequate for Discharge   Problem: Nutrition: Goal: Adequate nutrition will be maintained Outcome: Adequate for Discharge   Problem: Coping: Goal: Level of anxiety will decrease Outcome: Adequate for Discharge   Problem: Elimination: Goal: Will not experience complications related to bowel motility Outcome: Adequate for Discharge Goal: Will not experience complications related to urinary retention Outcome: Adequate for Discharge   Problem: Pain Managment: Goal: General experience of comfort will improve Outcome: Adequate for Discharge   Problem: Safety: Goal: Ability to remain free from injury will improve Outcome: Adequate for Discharge   Problem: Skin Integrity: Goal: Risk for impaired skin integrity will decrease Outcome: Adequate for Discharge  Discharge instructions reviewed with patient.  These included, but were not limited to, the following:  discharge medications, follow-up appointments, when to call the MD, etc.  Comprehension of instructions ascertained via use of "teach-back"  technique.  Discharge instructions given to patient.  Awaiting transportation to private residence at this time.

## 2021-01-03 NOTE — Discharge Instructions (Signed)
Gastroenteritis viral en los adultos Viral Gastroenteritis, Adult  La gastroenteritis viral tambin se conoce como gripe estomacal. Esta afeccin podra afectar el estmago, el intestino delgado y el intestino grueso. Puede causar Scherrie Bateman, fiebre y vmitos repentinos. Esta afeccin es causada por muchos virus diferentes. Estos virus pueden transmitirse de Neomia Dear persona a otra con mucha facilidad (son contagiosos). La diarrea y los vmitos pueden hacerlo sentir dbil y causar deshidratacin. Es posible que no pueda [employer]. La deshidratacin puede causarle cansancio, sed, sequedad en la boca y disminucin en la frecuencia con la que orina. Es importante restituir los lquidos que pierde por causa de la diarrea y los vmitos. Cules son las causas? La gastroenteritis es causada por muchos virus, entre los que se incluyen el rotavirus y el norovirus. El norovirus es la causa ms frecuente en los adultos. Puede enfermarse despus de estar expuesto a los virus de [employer]. Tambin puede enfermarse de las siguientes maneras:  A travs de la ingesta de alimentos o agua contaminados, o por tocar superficies contaminadas con alguno de estos virus.  Al compartir utensilios u otros artculos personales con una persona infectada. Qu incrementa el riesgo? Es ms probable que tenga esta afeccin si:  Tiene debilitado el sistema de defensa del organismo (sistema inmunitario).  Viven con uno o ms nios menores de 2aos.  Vive en un hogar de ancianos.  Viaja en un crucero. Cules son los signos o los sntomas? Los sntomas de esta afeccin suelen [employer] 1 y 3das despus de la exposicin al virus. Pueden durar [employer] o incluso [place]. Los sntomas frecuentes son Barnett Hatter lquida y vmitos. Otros sntomas pueden incluir los siguientes:  Teacher, English as a foreign language.  Dolor de [place].  Fatiga.  Dolor en el abdomen.  Escalofros.  Debilidad.  Nuseas.  Dolores  musculares.  Prdida del apetito. Cmo se diagnostica? Esta afeccin se diagnostica mediante una revisin de los antecedentes mdicos y un examen fsico. Tambin podran hacerle un anlisis de materia fecal para detectar virus u otras infecciones. Cmo se trata? Por lo general, esta afeccin desaparece por s sola. El tratamiento se centra en prevenir la deshidratacin y reponer los lquidos perdidos (rehidratacin). El tratamiento de esta afeccin puede incluir:  Una solucin de rehidratacin oral (SRO) para Surveyor, quantity y Energy manager (electrolitos) importantes en el cuerpo. Tmela si se lo indic el mdico. Esta es una bebida que se vende en farmacias y tiendas minoristas.  Medicamentos para [employer].  Suplementos probiticos para disminuir los sntomas de diarrea.  Administracin de lquidos por va intravenosa si la deshidratacin es grave. Los [employer] y las personas que tienen otras enfermedades o un sistema inmunitario dbil estn en mayor riesgo de deshidratacin. Siga estas instrucciones en su casa: Comida y bebida  Tomar una SRO como se lo haya indicado el mdico.  En la medida en que pueda, beba lquidos claros en pequeas cantidades. Los lquidos transparentes son, por ejemplo: ? Westley Hummer. ? Trocitos de hielo. ? Jugo de frutas diluido. ? Bebidas deportivas de bajas caloras.  Beba suficiente lquido como para Pharmacologist la orina de color amarillo plido.  Coma pequeas cantidades de alimentos saludables cada 3 a 4 horas segn su tolerancia. Estos pueden incluir cereales integrales, frutas, verduras, carnes magras y yogur.  Evite consumir lquidos que contengan mucha azcar o cafena, como bebidas energticas, bebidas deportivas y refrescos.  Evite los alimentos condimentados o con alto contenido de [place].  Evite tomar alcohol.   Instrucciones generales  Lvese las manos  con frecuencia, especialmente despus de tener diarrea o vmitos. Use  desinfectante para manos si no dispone de [place] y [place].  Asegrese de que todas las personas que viven en su casa se laven bien las manos y con frecuencia.  Tome los medicamentos de venta libre y los recetados solamente como se lo haya indicado el mdico.  Descanse en su casa mientras se recupera.  Controle su afeccin para Insurance risk surveyordetectar cualquier cambio.  Tome un bao caliente para ayudar a Engineer, manufacturing systemsdisminuir el ardor o el dolor causados por los episodios frecuentes de diarrea.  Concurra a todas las visitas de [address]seguimiento como se lo haya indicado el mdico. Esto es importante.   Comunquese con un mdico si:  No retiene los lquidos.  Tiene sntomas que empeoran.  Tiene nuevos sntomas.  Se siente mareado o siente que va a desvanecerse.  Presenta calambres musculares. Solicite ayuda inmediatamente si:  Midwifeiente dolor en el pecho.  Se siente muy dbil o se desmaya.  Observa sangre en el vmito.  Tiene vmito que se asemeja al poso del caf.  Tiene heces con sangre, de color negro, o con aspecto alquitranado.  Siente dolor de cabeza intenso, rigidez en el cuello, o ambas cosas.  Tiene una erupcin cutnea.  Tiene dolor intenso, clicos o distensin en el abdomen.  Tiene problemas para respirar o respira muy rpidamente.  Tiene latidos cardacos acelerados.  Tiene la piel fra y hmeda.  Se siente confundido.  Tiene dolor al ConocoPhillipsorinar.  Tiene signos de deshidratacin, como los siguientes: ? Orina de color oscuro, muy escasa o falta de [place]. ? Labios agrietados. ? Sequedad de boca. ? Ojos hundidos. ? Somnolencia. ? Debilidad. Resumen  La gastroenteritis viral tambin se conoce como gripe estomacal. Puede causar diarrea lquida, fiebre y vmitos repentinos.  Esta afeccin se puede transmitir de Burkina Fas[place]a persona a otra con mucha facilidad (es contagiosa).  Tome una SRO si se lo indic el mdico. Esta es una bebida que se vende en farmacias y tiendas minoristas.  Lvese las  manos con frecuencia, especialmente despus de tener diarrea o vmitos. Use desinfectante para manos si no dispone de [place] y [place]. Esta informacin no tiene Theme park managercomo fin reemplazar el consejo del mdico. Asegrese de hacerle al mdico cualquier pregunta que tenga. Document Revised: 07/14/2018 Document Reviewed: 07/14/2018 Elsevier Patient Education  2021 Elsevier Inc.   Viral Gastroenteritis, Adult  Viral gastroenteritis is also known as the stomach flu. This condition may affect your stomach, small intestine, and large intestine. It can cause sudden watery diarrhea, fever, and vomiting. This condition is caused by many different viruses. These viruses can be passed from person to person very easily (are contagious). Diarrhea and vomiting can make you feel weak and cause you to become dehydrated. You may not be able to keep fluids down. Dehydration can make you tired and thirsty, cause you to have a dry mouth, and decrease how often you urinate. It is important to replace the fluids that you lose from diarrhea and vomiting. What are the causes? Gastroenteritis is caused by many viruses, including rotavirus and norovirus. Norovirus is the most common cause in adults. You can get sick after being exposed to the viruses from other people. You can also get sick by:  Eating food, drinking water, or touching a surface contaminated with one of these viruses.  Sharing utensils or other personal items with an infected person. What increases the risk? You are more likely to develop this condition if you:  Have a weak body  defense system (immune system).  Live with one or more children who are younger than 65 years old.  Live in a nursing home.  Travel on cruise ships. What are the signs or symptoms? Symptoms of this condition start suddenly 1-3 days after exposure to a virus. Symptoms may last for a few days or for as long as a week. Common symptoms include watery diarrhea and vomiting. Other symptoms  include:  Fever.  Headache.  Fatigue.  Pain in the abdomen.  Chills.  Weakness.  Nausea.  Muscle aches.  Loss of appetite. How is this diagnosed? This condition is diagnosed with a medical history and physical exam. You may also have a stool test to check for viruses or other infections. How is this treated? This condition typically goes away on its own. The focus of treatment is to prevent dehydration and restore lost fluids (rehydration). This condition may be treated with:  An oral rehydration solution (ORS) to replace important salts and minerals (electrolytes) in your body. Take this if told by your health care provider. This is a drink that is sold at pharmacies and retail stores.  Medicines to help with your symptoms.  Probiotic supplements to reduce symptoms of diarrhea.  Fluids given through an IV, if dehydration is severe. Older adults and people with other diseases or a weak immune system are at higher risk for dehydration. Follow these instructions at home: Eating and drinking  Take an ORS as told by your health care provider.  Drink clear fluids in small amounts as you are able. Clear fluids include: ? Water. ? Ice chips. ? Diluted fruit juice. ? Low-calorie sports drinks.  Drink enough fluid to keep your urine pale yellow.  Eat small amounts of healthy foods every 3-4 hours as you are able. This may include whole grains, fruits, vegetables, lean meats, and yogurt.  Avoid fluids that contain a lot of sugar or caffeine, such as energy drinks, sports drinks, and soda.  Avoid spicy or fatty foods.  Avoid alcohol.   General instructions  Wash your hands often, especially after having diarrhea or vomiting. If soap and water are not available, use hand sanitizer.  Make sure that all people in your household wash their hands well and often.  Take over-the-counter and prescription medicines only as told by your health care provider.  Rest at home while  you recover.  Watch your condition for any changes.  Take a warm bath to relieve any burning or pain from frequent diarrhea episodes.  Keep all follow-up visits as told by your health care provider. This is important.   Contact a health care provider if you:  Cannot keep fluids down.  Have symptoms that get worse.  Have new symptoms.  Feel light-headed or dizzy.  Have muscle cramps. Get help right away if you:  Have chest pain.  Feel extremely weak or you faint.  See blood in your vomit.  Have vomit that looks like coffee grounds.  Have bloody or black stools or stools that look like tar.  Have a severe headache, a stiff neck, or both.  Have a rash.  Have severe pain, cramping, or bloating in your abdomen.  Have trouble breathing or you are breathing very quickly.  Have a fast heartbeat.  Have skin that feels cold and clammy.  Feel confused.  Have pain when you urinate.  Have signs of dehydration, such as: ? Dark urine, very little urine, or no urine. ? Cracked lips. ? Dry mouth. ?  Sunken eyes. ? Sleepiness. ? Weakness. Summary  Viral gastroenteritis is also known as the stomach flu. It can cause sudden watery diarrhea, fever, and vomiting.  This condition can be passed from person to person very easily (is contagious).  Take an ORS if told by your health care provider. This is a drink that is sold at pharmacies and retail stores.  Wash your hands often, especially after having diarrhea or vomiting. If soap and water are not available, use hand sanitizer. This information is not intended to replace advice given to you by your health care provider. Make sure you discuss any questions you have with your health care provider. Document Revised: 01/19/2019 Document Reviewed: 06/07/2018 Elsevier Patient Education  2021 [employer].

## 2021-01-03 NOTE — Discharge Summary (Addendum)
DISCHARGE SUMMARY  Natasha Roberts  MR#: [id number]  DOB:1961-07-30  Date of Admission: 12/31/2020 Date of Discharge: 01/03/2021  Attending Physician:Letty Salvi Silvestre Gunner, MD  Patient's PCP:Pcp, No  Consults: none  Disposition: D/C home    Follow-up Appts:  Follow-up Information    Your Primary Care Provider Follow up.   Why: See your Primary Care Provider as needed if you develop new symptoms.        [facility]. Schedule an appointment as soon as possible for a visit in 2 week(s).   Why: Call to establish a Primary Care Provider if you do not have one.  Contact information: [address] [place] [address] [phone]              Discharge Diagnoses: Viral gastroenteritis Mild transaminitis Abnormal UA Hypothyroidism  Follow Up Issues: Consider rechecking LFTs in f/u to assure have normalized   Initial presentation: 59yo with a history of hypothyroidism who presented to the ED with a several day history of diffuse abdominal pain, nausea/vomiting, and generalized myalgia.  She admitted to very limited oral intake.  In the ED she was found to have a temperature of 103.2.  Hospital Course:  Viral gastroenteritis negative CT abdomen/pelvis - COVID and influenza negative - improved greatly w/ simple volume resuscitation/IVF support - able to tolerate oral intake w/o difficutly at time of d/c  Mild transaminitis Due to above - improving at time of d/c   Recent Labs  Lab 12/31/20 2009 01/03/21 0028  AST 142* 111*  ALT 165* 132*  ALKPHOS 180* 211*  BILITOT 1.8* 0.7  PROT 6.5 5.6*  ALBUMIN 3.5 2.8*     Abnormal UA - Possible UTI POA Urine culture not convincing for significant colony count - has nonetheless completed 3 days of abx tx which should be sufficient - clinically no persisting sx of active infection   Hypothyroidism Continue home Synthroid dose  Allergies as of 01/03/2021   No Known  Allergies     Medication List    TAKE these medications   acetaminophen 325 MG tablet Commonly known as: TYLENOL Take 2 tablets (650 mg total) by mouth every 6 (six) hours as needed for mild pain (or Fever >/= 101).   B-complex with vitamin C tablet Take 1 tablet by mouth daily.   glucosamine-chondroitin 500-400 MG tablet Take 1 tablet by mouth daily.   levothyroxine 100 MCG tablet Commonly known as: SYNTHROID Take 100 mcg by mouth daily before breakfast.   multivitamin tablet Take 1 tablet by mouth daily.   vitamin C 1000 MG tablet Take 1,000 mg by mouth daily.       Day of Discharge BP 103/78 (BP Location: Right Arm)   Pulse 82   Temp 98.9 F (37.2 C) (Oral)   Resp 20   Ht 5\' 1"  (1.549 m)   Wt 54.4 kg   SpO2 95%   BMI 22.67 kg/m   Physical Exam: General: No acute respiratory distress Lungs: Clear to auscultation bilaterally without wheezes or crackles Cardiovascular: Regular rate and rhythm without murmur gallop or rub normal S1 and S2 Abdomen: Nontender, nondistended, soft, bowel sounds positive, no rebound, no ascites, no appreciable mass Extremities: No significant cyanosis, clubbing, or edema bilateral lower extremities  Basic Metabolic Panel: Recent Labs  Lab 12/31/20 2009 01/02/21 0115 01/03/21 0028  NA 135 136 139  K 3.9 3.1* 3.7  CL 99 103 108  CO2 26 25 21*  GLUCOSE 98 122* 104*  BUN <5* <5* <5*  CREATININE 0.83 0.70 0.54  CALCIUM 8.5* 8.1* 8.1*  MG  --  2.0 2.2  PHOS  --  3.9 3.2    Liver Function Tests: Recent Labs  Lab 12/31/20 2009 01/03/21 0028  AST 142* 111*  ALT 165* 132*  ALKPHOS 180* 211*  BILITOT 1.8* 0.7  PROT 6.5 5.6*  ALBUMIN 3.5 2.8*   CBC: Recent Labs  Lab 12/31/20 2009 01/02/21 0115 01/03/21 0028  WBC 2.8* 3.8* 6.1  NEUTROABS 2.0  --   --   HGB 15.2* 14.5 14.1  HCT 45.4 41.6 41.1  MCV 94.2 91.2 92.8  PLT PLATELET CLUMPS NOTED ON SMEAR, UNABLE TO ESTIMATE 84* 103*    Recent Results (from the past 240  hour(s))  Resp Panel by RT-PCR (Flu A&B, Covid) Nasopharyngeal Swab     Status: None   Collection Time: 12/31/20  5:01 PM   Specimen: Nasopharyngeal Swab; Nasopharyngeal(NP) swabs in vial transport medium  Result Value Ref Range Status   SARS Coronavirus 2 by RT PCR NEGATIVE NEGATIVE Final    Comment: (NOTE) SARS-CoV-2 target nucleic acids are NOT DETECTED.  The SARS-CoV-2 RNA is generally detectable in upper respiratory specimens during the acute phase of infection. The lowest concentration of SARS-CoV-2 viral copies this assay can detect is 138 copies/mL. A negative result does not preclude SARS-Cov-2 infection and should not be used as the sole basis for treatment or other patient management decisions. A negative result may occur with  improper specimen collection/handling, submission of specimen other than nasopharyngeal swab, presence of viral mutation(s) within the areas targeted by this assay, and inadequate number of viral copies(<138 copies/mL). A negative result must be combined with clinical observations, patient history, and epidemiological information. The expected result is Negative.  Fact Sheet for Patients:  BloggerCourse.com  Fact Sheet for Healthcare Providers:  SeriousBroker.it  This test is no t yet approved or cleared by the [place] FDA and  has been authorized for detection and/or diagnosis of SARS-CoV-2 by FDA under an Emergency Use Authorization (EUA). This EUA will remain  in effect (meaning this test can be used) for the duration of the COVID-19 declaration under Section 564(b)(1) of the Act, 21 U.S.C.section 360bbb-3(b)(1), unless the authorization is terminated  or revoked sooner.       Influenza A by PCR NEGATIVE NEGATIVE Final   Influenza B by PCR NEGATIVE NEGATIVE Final    Comment: (NOTE) The Xpert Xpress SARS-CoV-2/FLU/RSV plus assay is intended as an aid in the diagnosis of influenza from  Nasopharyngeal swab specimens and should not be used as a sole basis for treatment. Nasal washings and aspirates are unacceptable for Xpert Xpress SARS-CoV-2/FLU/RSV testing.  Fact Sheet for Patients: BloggerCourse.com  Fact Sheet for Healthcare Providers: SeriousBroker.it  This test is not yet approved or cleared by the [place] FDA and has been authorized for detection and/or diagnosis of SARS-CoV-2 by FDA under an Emergency Use Authorization (EUA). This EUA will remain in effect (meaning this test can be used) for the duration of the COVID-19 declaration under Section 564(b)(1) of the Act, 21 U.S.C. section 360bbb-3(b)(1), unless the authorization is terminated or revoked.  Performed at [facility], [address] [place], [place] [address]   Urine culture     Status: Abnormal (Preliminary result)   Collection Time: 12/31/20  7:33 PM   Specimen: In/Out Cath Urine  Result Value Ref Range Status   Specimen Description IN/OUT CATH URINE  Final   Special  Requests NONE  Final   Culture (A)  Final    20,000 COLONIES/mL KLEBSIELLA PNEUMONIAE 1,000 COLONIES/mL STAPHYLOCOCCUS HAEMOLYTICUS SUSCEPTIBILITIES TO FOLLOW Performed at [facility], [address] [place], [place] [address]    Report Status PENDING  Incomplete   Organism ID, Bacteria KLEBSIELLA PNEUMONIAE (A)  Final      Susceptibility   Klebsiella pneumoniae - MIC*    AMPICILLIN >=32 RESISTANT Resistant     CEFAZOLIN <=4 SENSITIVE Sensitive     CEFEPIME <=0.12 SENSITIVE Sensitive     CEFTRIAXONE <=0.25 SENSITIVE Sensitive     CIPROFLOXACIN 1 SENSITIVE Sensitive     GENTAMICIN <=1 SENSITIVE Sensitive     IMIPENEM <=0.25 SENSITIVE Sensitive     NITROFURANTOIN 256 RESISTANT Resistant     TRIMETH/SULFA <=20 SENSITIVE Sensitive     AMPICILLIN/SULBACTAM 8 SENSITIVE Sensitive     PIP/TAZO 8 SENSITIVE Sensitive     * 20,000 COLONIES/mL KLEBSIELLA  PNEUMONIAE  Blood Culture (routine x 2)     Status: None (Preliminary result)   Collection Time: 12/31/20  8:09 PM   Specimen: BLOOD  Result Value Ref Range Status   Specimen Description BLOOD RIGHT ANTECUBITAL  Final   Special Requests   Final    BOTTLES DRAWN AEROBIC AND ANAEROBIC Blood Culture results may not be optimal due to an inadequate volume of blood received in culture bottles   Culture   Final    NO GROWTH 2 DAYS Performed at [facility], [address] [place], [place] [address]    Report Status PENDING  Incomplete  Blood Culture (routine x 2)     Status: None (Preliminary result)   Collection Time: 12/31/20  8:14 PM   Specimen: BLOOD RIGHT HAND  Result Value Ref Range Status   Specimen Description BLOOD RIGHT HAND  Final   Special Requests   Final    BOTTLES DRAWN AEROBIC AND ANAEROBIC Blood Culture results may not be optimal due to an inadequate volume of blood received in culture bottles   Culture   Final    NO GROWTH 2 DAYS Performed at [facility], [address], [place], [place] [address]    Report Status PENDING  Incomplete      Time spent in discharge (includes decision making & examination of pt): 30 minutes  01/03/2021, 11:55 AM   Lonia Blood, MD [facility] Office  [phone]

## 2021-01-04 LAB — URINE CULTURE: Culture: 20000 — AB

## 2021-01-05 LAB — CULTURE, BLOOD (ROUTINE X 2)
Culture: NO GROWTH
Culture: NO GROWTH

## 2021-01-26 ENCOUNTER — Other Ambulatory Visit: Payer: Self-pay

## 2021-01-26 ENCOUNTER — Ambulatory Visit: Payer: 59 | Admitting: Physician Assistant

## 2021-01-26 VITALS — BP 138/86 | HR 84 | Temp 98.7°F | Resp 18 | Ht 65.0 in | Wt 128.0 lb

## 2021-01-26 DIAGNOSIS — R110 Nausea: Secondary | ICD-10-CM | POA: Diagnosis not present

## 2021-01-26 DIAGNOSIS — R1013 Epigastric pain: Secondary | ICD-10-CM | POA: Diagnosis not present

## 2021-01-26 DIAGNOSIS — R509 Fever, unspecified: Secondary | ICD-10-CM | POA: Diagnosis not present

## 2021-01-26 DIAGNOSIS — R519 Headache, unspecified: Secondary | ICD-10-CM | POA: Diagnosis not present

## 2021-01-26 DIAGNOSIS — R748 Abnormal levels of other serum enzymes: Secondary | ICD-10-CM

## 2021-01-26 DIAGNOSIS — D696 Thrombocytopenia, unspecified: Secondary | ICD-10-CM

## 2021-01-26 DIAGNOSIS — E039 Hypothyroidism, unspecified: Secondary | ICD-10-CM | POA: Insufficient documentation

## 2021-01-26 DIAGNOSIS — Z13220 Encounter for screening for lipoid disorders: Secondary | ICD-10-CM

## 2021-01-26 DIAGNOSIS — E038 Other specified hypothyroidism: Secondary | ICD-10-CM

## 2021-01-26 LAB — POC COVID19 BINAXNOW: SARS Coronavirus 2 Ag: NEGATIVE

## 2021-01-26 MED ORDER — ONDANSETRON 8 MG PO TBDP: 8.0000 mg | ORAL_TABLET | Freq: Three times a day (TID) | ORAL | 0 refills | Status: DC | PRN

## 2021-01-26 MED ORDER — PANTOPRAZOLE SODIUM 40 MG PO TBEC: 40.0000 mg | DELAYED_RELEASE_TABLET | Freq: Every day | ORAL | 3 refills | Status: DC

## 2021-01-26 NOTE — Assessment & Plan Note (Addendum)
The patient received both Rocephin and Flagyl in the hospital to treat the urinary tract infection she was experiencing. Take Zofran as prescribed. Follow up with PCP

## 2021-01-26 NOTE — Assessment & Plan Note (Addendum)
Upon review of the medical record the patient had an EKG in the emergency department prior to her hospital admission which showed sinus tachycardia and a right bundle branch block. TSH ordered. Discussed will prescribe levothyroxine for patient based on TSH levels. Follow up with PCP.

## 2021-01-26 NOTE — Patient Instructions (Signed)
For your abdominal pain, you will start taking Protonix once daily, it is okay to go ahead and take a dose this evening.  For your nausea, you will use Zofran.    Please return to the mobile unit in the morning for fasting labs.  Roney Jaffe, PA-C Physician Assistant [facility] Medicine [email or web address]   Dolor abdominal en los adultos Abdominal Pain, Adult El dolor en el abdomen (dolor abdominal) puede tener muchas causas. A menudo, el dolor abdominal no es grave y [place] sin tratamiento o con tratamiento en la casa. Sin embargo, a Musician es grave. El mdico le har preguntas sobre sus antecedentes mdicos y le har un examenfsico para tratar de Production assistant, radio causa del dolor abdominal. Siga estas instrucciones en su casa:  Medicamentos Use los medicamentos de venta libre y los recetados solamente como se lo haya indicado el mdico. No tome un laxante a menos que se lo haya indicado el mdico. Instrucciones generales Controle su afeccin para detectar cualquier cambio. Beba suficiente lquido como para Pharmacologist la orina de color amarillo plido. Concurra a todas las visitas de [address] se lo haya indicado el mdico. Esto es importante. Comunquese con un mdico si: El dolor abdominal cambia o empeora. No tiene apetito o baja de peso sin proponrselo. Est estreido o tiene diarrea durante ms de 2 o 3 das. Tiene dolor cuando orina o defeca. El dolor abdominal lo despierta de noche. El dolor empeora con las comidas, despus de comer o con determinados alimentos. Tiene vmitos y no puede retener nada de lo que ingiere. Tiene fiebre. Observa sangre en la orina. Solicite ayuda inmediatamente si: El dolor no desaparece tan pronto como el mdico le dijo que era esperable. No puede dejar de vomitar. El Engineer, mining se siente solo en zonas del abdomen, como el lado derecho o la parte inferior izquierda del abdomen.  Si se localiza en la zona derecha, posiblemente podra tratarse de apendicitis. Las heces son sanguinolentas o de color negro, o de aspecto alquitranado. Tiene dolor intenso, clicos o distensin abdominal. Tiene signos de deshidratacin, como los siguientes: Orina de color oscuro, muy escasa o falta de orina. Labios agrietados. Sequedad de boca. Ojos hundidos. Somnolencia. Debilidad. Tiene dificultad para respirar o Journalist, newspaper. Resumen A menudo, el dolor abdominal no es grave y [place] sin tratamiento o con tratamiento en la casa. Sin embargo, a Facilities manager abdominal es grave. Controle su afeccin para Insurance risk surveyor cambio. Use los medicamentos de venta libre y los recetados solamente como se lo haya indicado el mdico. Comunquese con un mdico si el dolor abdominal cambia o [place]. Busque ayuda de inmediato si tiene dolor intenso, clicos o distensin abdominal. Esta informacin no tiene Theme park manager el consejo del mdico. Asegresede hacerle al mdico cualquier pregunta que tenga. Document Revised: 02/07/2019 Document Reviewed: 02/07/2019 Elsevier Patient Education  2022 [employer].

## 2021-01-26 NOTE — Assessment & Plan Note (Signed)
Liver enzyme labs ordered. Advised patient to return tomorrow for labs so they can be fasting labs.  Follow up with PCP

## 2021-01-26 NOTE — Assessment & Plan Note (Signed)
Take pantoprazole 40 mg as directed. CMP and H. Pylori labs ordered. Follow up with PCP

## 2021-01-26 NOTE — Progress Notes (Signed)
New Patient Office Visit  Subjective:  Patient ID: Natasha Roberts, female    DOB: [birth year]  Age: 60 y.o. MRN: [id number]  CC:  Chief Complaint  Patient presents with   Follow-up    Viral Gaster    HPI Natasha Roberts presents for a hospital follow up visit. The patient was recently hospitalized from 12/31/2020 to 01/03/2021. Hospital course as follows:  Discharge Diagnoses: Viral gastroenteritis Mild transaminitis Abnormal UA Hypothyroidism   Follow Up Issues: Consider rechecking LFTs in f/u to assure have normalized    Initial presentation: 59yo with a history of hypothyroidism who presented to the ED with a several day history of diffuse abdominal pain, nausea/vomiting, and generalized myalgia.  She admitted to very limited oral intake.  In the ED she was found to have a temperature of 103.2.   Hospital Course:   Viral gastroenteritis negative CT abdomen/pelvis - COVID and influenza negative - improved greatly w/ simple volume resuscitation/IVF support - able to tolerate oral intake w/o difficutly at time of d/c   Mild transaminitis Due to above - improving at time of d/c    Last Labs       Recent Labs  Lab 12/31/20 2009 01/03/21 0028  AST 142* 111*  ALT 165* 132*  ALKPHOS 180* 211*  BILITOT 1.8* 0.7  PROT 6.5 5.6*  ALBUMIN 3.5 2.8*          Abnormal UA - Possible UTI POA Urine culture not convincing for significant colony count - has nonetheless completed 3 days of abx tx which should be sufficient - clinically no persisting sx of active infection    Hypothyroidism Continue home Synthroid dose    Today, the patient presents for evaluation regarding epigastric and back pain. The patient states her symptoms have been constant since before her hospitalization. She also reports nausea, headaches, body aches, and intermittent heart palpitations.   The patient has been taking Tylenol which has helped her headaches and back pain but not her stomach pain.  The  patient had some vomiting before the inpatient hospital stay but has not had any vomiting since being discharged.    Patient currently denies any dysuria or other problems with urination since being discharged from the hospital. The patient also has a history of hypothyroidism but has been out of her levothyroxine prescription for the past month.   Due to language barrier, an interpreter was present during the history-taking and subsequent discussion (and for part of the physical exam) with this patient.    Past Medical History:  Diagnosis Date   Thyroid disease hypothyroidism    No past surgical history on file.  No family history on file.  Social History   Socioeconomic History   Marital status: Single    Spouse name: Not on file   Number of children: Not on file   Years of education: Not on file   Highest education level: Not on file  Occupational History   Not on file  Tobacco Use   Smoking status: Some Days    Pack years: 0.00   Smokeless tobacco: Not on file  Substance and Sexual Activity   Alcohol use: Never   Drug use: Never   Sexual activity: Not on file  Other Topics Concern   Not on file  Social History Narrative   Not on file   Social Determinants of Health   Financial Resource Strain: Not on file  Food Insecurity: Not on file  Transportation Needs: Not on file  Physical  Activity: Not on file  Stress: Not on file  Social Connections: Not on file  Intimate Partner Violence: Not on file    ROS Review of Systems  Constitutional:  Positive for chills.  HENT:  Negative for congestion and rhinorrhea.   Eyes:  Negative for pain.  Respiratory:  Negative for cough and shortness of breath.   Cardiovascular:  Positive for palpitations. Negative for chest pain.  Gastrointestinal:  Positive for abdominal pain (epigastric) and nausea. Negative for diarrhea and vomiting.  Endocrine: Negative for polyuria.  Genitourinary:  Negative for difficulty urinating and  dysuria.  Musculoskeletal:  Positive for back pain.  Skin: Negative.   Neurological:  Positive for headaches.   Objective:   Today's Vitals: Ht 5\' 5"  (1.651 m)   Wt 128 lb (58.1 kg)   BMI 21.30 kg/m   Physical Exam Vitals and nursing note reviewed.  Constitutional:      General: She is not in acute distress. HENT:     Head: Normocephalic and atraumatic.  Eyes:     Conjunctiva/sclera: Conjunctivae normal.  Cardiovascular:     Rate and Rhythm: Normal rate and regular rhythm.     Heart sounds: Normal heart sounds. No murmur heard.   No friction rub. No gallop.  Pulmonary:     Effort: Pulmonary effort is normal. No respiratory distress.     Breath sounds: Normal breath sounds. No wheezing.  Abdominal:     General: There is no distension.     Palpations: Abdomen is soft.     Tenderness: There is abdominal tenderness (epigastric tenderness to palpation.). There is right CVA tenderness and left CVA tenderness.  Musculoskeletal:        General: Normal range of motion.     Cervical back: Normal range of motion.  Neurological:     Mental Status: She is alert and oriented to person, place, and time.  Psychiatric:        Behavior: Behavior normal.    Assessment & Plan:   Problem List Items Addressed This Visit       Endocrine   Hypothyroidism    Upon review of the medical record the patient had an EKG in the emergency department prior to her hospital admission which showed sinus tachycardia and a right bundle branch block. TSH ordered. Discussed will prescribe levothyroxine for patient based on TSH levels. Follow up with PCP.        Relevant Orders   TSH     Other   Abdominal pain, epigastric    Take pantoprazole 40 mg as directed. CMP and H. Pylori labs ordered. Follow up with PCP        Relevant Medications   pantoprazole (PROTONIX) 40 MG tablet   Other Relevant Orders   Comp. Metabolic Panel (12)   H Pylori, IGM, IGG, IGA AB   Nausea without vomiting     The patient received both Rocephin and Flagyl in the hospital to treat the urinary tract infection she was experiencing. Take Zofran as prescribed. Follow up with PCP       Relevant Medications   ondansetron (ZOFRAN ODT) 8 MG disintegrating tablet   Elevated liver enzymes    Liver enzyme labs ordered. Advised patient to return tomorrow for labs so they can be fasting labs.  Follow up with PCP        Relevant Orders   Hepatitis panel, acute   Other Visit Diagnoses     Fever, unspecified    -  Primary  Relevant Orders   POC COVID-19 (Completed)   POCT URINALYSIS DIP (CLINITEK) (Completed)   Acute nonintractable headache, unspecified headache type       Relevant Orders   POC COVID-19 (Completed)   Thrombocytopenia (HCC)       Relevant Orders   CBC with Differential/Platelet   Screening, lipid       Relevant Orders   Lipid panel       Outpatient Encounter Medications as of 01/26/2021  Medication Sig   ondansetron (ZOFRAN ODT) 8 MG disintegrating tablet Take 1 tablet (8 mg total) by mouth every 8 (eight) hours as needed for nausea or vomiting.   pantoprazole (PROTONIX) 40 MG tablet Take 1 tablet (40 mg total) by mouth daily.   acetaminophen (TYLENOL) 325 MG tablet Take 2 tablets (650 mg total) by mouth every 6 (six) hours as needed for mild pain (or Fever >/= 101).   Ascorbic Acid (VITAMIN C) 1000 MG tablet Take 1,000 mg by mouth daily.   B Complex-C (B-COMPLEX WITH VITAMIN C) tablet Take 1 tablet by mouth daily.   glucosamine-chondroitin 500-400 MG tablet Take 1 tablet by mouth daily.   levothyroxine (SYNTHROID) 100 MCG tablet Take 100 mcg by mouth daily before breakfast.   Multiple Vitamin (MULTIVITAMIN) tablet Take 1 tablet by mouth daily.   No facility-administered encounter medications on file as of 01/26/2021.   Patient has appt to establish care at [facility] on 05/01/21, will have patient f/u in [facility] in 2 weeks   I have reviewed the patient's medical history (PMH, PSH,  Social History, Family History, Medications, and allergies) , and have been updated if relevant. I spent 36 minutes reviewing chart and  face to face time with patient.   Follow-up: Return in about 1 day (around 01/27/2021) for Fasting  labs.   Claudia Desanctis, Student-PA  Roney Jaffe, PA-C Physician Assistant [facility] Medicine [email or web address]

## 2021-01-26 NOTE — Progress Notes (Signed)
Patient presents with fever, HA and stomach pain on today. Patient was seen in the ED on 5/18 for similar concerns which had "resolved" upon discharge.  Patient has only had tylenol since her discharge. Patient has eaten today.

## 2021-01-27 ENCOUNTER — Other Ambulatory Visit: Payer: 59

## 2021-01-27 DIAGNOSIS — E785 Hyperlipidemia, unspecified: Secondary | ICD-10-CM

## 2021-01-27 DIAGNOSIS — R1013 Epigastric pain: Secondary | ICD-10-CM

## 2021-01-27 DIAGNOSIS — R748 Abnormal levels of other serum enzymes: Secondary | ICD-10-CM

## 2021-01-27 DIAGNOSIS — E038 Other specified hypothyroidism: Secondary | ICD-10-CM

## 2021-01-27 DIAGNOSIS — D696 Thrombocytopenia, unspecified: Secondary | ICD-10-CM

## 2021-01-27 DIAGNOSIS — A048 Other specified bacterial intestinal infections: Secondary | ICD-10-CM

## 2021-01-27 DIAGNOSIS — Z13220 Encounter for screening for lipoid disorders: Secondary | ICD-10-CM

## 2021-01-27 LAB — POCT URINALYSIS DIP (CLINITEK)
Bilirubin, UA: NEGATIVE
Glucose, UA: NEGATIVE mg/dL
Ketones, POC UA: NEGATIVE mg/dL
Leukocytes, UA: NEGATIVE
Nitrite, UA: NEGATIVE
POC PROTEIN,UA: NEGATIVE
Spec Grav, UA: >=1.03 — AB (ref 1.010–1.025)
Urobilinogen, UA: 0.2 E.U./dL
pH, UA: 5.5 (ref 5.0–8.0)

## 2021-01-27 NOTE — Progress Notes (Signed)
Patient tolerated blood draw well.

## 2021-01-27 NOTE — Patient Instructions (Signed)
Patient aware of receiving a result call on this coming Monday.

## 2021-01-28 DIAGNOSIS — D696 Thrombocytopenia, unspecified: Secondary | ICD-10-CM | POA: Insufficient documentation

## 2021-01-29 ENCOUNTER — Telehealth: Payer: Self-pay | Admitting: *Deleted

## 2021-01-29 LAB — CBC WITH DIFFERENTIAL/PLATELET
Basophils Absolute: 0.1 10*3/uL (ref 0.0–0.2)
Basos: 1 %
EOS (ABSOLUTE): 0.1 10*3/uL (ref 0.0–0.4)
Eos: 2 %
Hematocrit: 44.1 % (ref 34.0–46.6)
Hemoglobin: 14.5 g/dL (ref 11.1–15.9)
Immature Grans (Abs): 0 10*3/uL (ref 0.0–0.1)
Immature Granulocytes: 0 %
Lymphocytes Absolute: 2.8 10*3/uL (ref 0.7–3.1)
Lymphs: 47 %
MCH: 31.2 pg (ref 26.6–33.0)
MCHC: 32.9 g/dL (ref 31.5–35.7)
MCV: 95 fL (ref 79–97)
Monocytes Absolute: 0.4 10*3/uL (ref 0.1–0.9)
Monocytes: 7 %
Neutrophils Absolute: 2.5 10*3/uL (ref 1.4–7.0)
Neutrophils: 43 %
Platelets: 201 10*3/uL (ref 150–450)
RBC: 4.65 x10E6/uL (ref 3.77–5.28)
RDW: 13.6 % (ref 11.7–15.4)
WBC: 5.8 10*3/uL (ref 3.4–10.8)

## 2021-01-29 LAB — THYROID PANEL WITH TSH
Free Thyroxine Index: 2.8 (ref 1.2–4.9)
T3 Uptake Ratio: 29 % (ref 24–39)
T4, Total: 9.5 ug/dL (ref 4.5–12.0)
TSH: 0.069 u[IU]/mL — ABNORMAL LOW (ref 0.450–4.500)

## 2021-01-29 LAB — LIPID PANEL
Chol/HDL Ratio: 5.4 ratio — ABNORMAL HIGH (ref 0.0–4.4)
Cholesterol, Total: 221 mg/dL — ABNORMAL HIGH (ref 100–199)
HDL: 41 mg/dL (ref >39)
LDL Chol Calc (NIH): 135 mg/dL — ABNORMAL HIGH (ref 0–99)
Triglycerides: 251 mg/dL — ABNORMAL HIGH (ref 0–149)
VLDL Cholesterol Cal: 45 mg/dL — ABNORMAL HIGH (ref 5–40)

## 2021-01-29 LAB — COMP. METABOLIC PANEL (12)
AST: 18 IU/L (ref 0–40)
Albumin/Globulin Ratio: 2 (ref 1.2–2.2)
Albumin: 4.6 g/dL (ref 3.8–4.9)
Alkaline Phosphatase: 93 IU/L (ref 44–121)
BUN/Creatinine Ratio: 17 (ref 9–23)
BUN: 10 mg/dL (ref 6–24)
Bilirubin Total: 0.3 mg/dL (ref 0.0–1.2)
Calcium: 9.3 mg/dL (ref 8.7–10.2)
Chloride: 106 mmol/L (ref 96–106)
Creatinine, Ser: 0.59 mg/dL (ref 0.57–1.00)
Globulin, Total: 2.3 g/dL (ref 1.5–4.5)
Glucose: 107 mg/dL — ABNORMAL HIGH (ref 65–99)
Potassium: 4.2 mmol/L (ref 3.5–5.2)
Sodium: 141 mmol/L (ref 134–144)
Total Protein: 6.9 g/dL (ref 6.0–8.5)
eGFR: 104 mL/min/{1.73_m2} (ref >59)

## 2021-01-29 LAB — H PYLORI, IGM, IGG, IGA AB
H pylori, IgM Abs: <9 units (ref 0.0–8.9)
H. pylori, IgA Abs: 28.4 units — ABNORMAL HIGH (ref 0.0–8.9)
H. pylori, IgG AbS: 0.21 Index Value (ref 0.00–0.79)

## 2021-01-29 NOTE — Telephone Encounter (Signed)
-----   Message from Roney Jaffe, [place] sent at 01/29/2021 10:11 AM EDT ----- Please call patient and ask to return to [facility] asap for lab results

## 2021-01-29 NOTE — Telephone Encounter (Signed)
Medical Assistant used [employer] to contact patient.  Interpreter Name: Interpreter #: [phone] MA UTR patient x4 with invalid contact. Patient needs to return to [facility] to discuss results via in person or virtual.

## 2021-01-30 LAB — ACUTE VIRAL HEPATITIS (HAV, HBV, HCV)
HCV Ab: <0.1 s/co ratio (ref 0.0–0.9)
Hep A IgM: NEGATIVE
Hep B C IgM: NEGATIVE
Hepatitis B Surface Ag: NEGATIVE

## 2021-01-30 LAB — HCV INTERPRETATION

## 2021-01-30 LAB — SPECIMEN STATUS REPORT

## 2021-02-03 ENCOUNTER — Telehealth: Payer: Self-pay | Admitting: *Deleted

## 2021-02-03 ENCOUNTER — Encounter: Payer: Self-pay | Admitting: *Deleted

## 2021-02-03 NOTE — Telephone Encounter (Signed)
-----   Message from Cari S Mayers, PA-C sent at 01/29/2021 10:11 AM EDT ----- Please call patient and ask to return to [facility] asap for lab results  

## 2021-02-03 NOTE — Telephone Encounter (Signed)
Medical Assistant used [employer] to contact patient.  Interpreter Name: Marcelino Duster Interpreter #: [phone]

## 2021-02-11 ENCOUNTER — Telehealth: Payer: 59

## 2021-02-11 MED ORDER — CLARITHROMYCIN 500 MG PO TABS: 500.0000 mg | ORAL_TABLET | Freq: Two times a day (BID) | ORAL | 0 refills | Status: AC

## 2021-02-11 MED ORDER — AMOXICILLIN 500 MG PO CAPS: 1000.0000 mg | ORAL_CAPSULE | Freq: Two times a day (BID) | ORAL | 0 refills | Status: AC

## 2021-02-11 MED ORDER — ATORVASTATIN CALCIUM 10 MG PO TABS: 10.0000 mg | ORAL_TABLET | Freq: Every day | ORAL | 3 refills | Status: DC

## 2021-02-11 NOTE — Addendum Note (Signed)
Addended by: Roney Jaffe on: 02/11/2021 02:22 PM   Modules accepted: Orders

## 2021-02-12 NOTE — Progress Notes (Signed)
Communication letter has been mailed to the patient with detailed information due UTR.

## 2021-02-16 ENCOUNTER — Telehealth: Payer: 59

## 2021-02-25 ENCOUNTER — Other Ambulatory Visit: Payer: Self-pay

## 2021-02-25 ENCOUNTER — Encounter: Payer: Self-pay | Admitting: Physician Assistant

## 2021-02-25 ENCOUNTER — Ambulatory Visit: Payer: 59 | Admitting: Physician Assistant

## 2021-02-25 VITALS — BP 123/84 | HR 96 | Temp 98.8°F | Resp 18 | Ht 62.0 in | Wt 135.0 lb

## 2021-02-25 DIAGNOSIS — E038 Other specified hypothyroidism: Secondary | ICD-10-CM

## 2021-02-25 DIAGNOSIS — R1013 Epigastric pain: Secondary | ICD-10-CM | POA: Diagnosis not present

## 2021-02-25 DIAGNOSIS — A048 Other specified bacterial intestinal infections: Secondary | ICD-10-CM

## 2021-02-25 DIAGNOSIS — E782 Mixed hyperlipidemia: Secondary | ICD-10-CM | POA: Diagnosis not present

## 2021-02-25 NOTE — Patient Instructions (Signed)
For your stomach infection, H. pylori, you are going to take 2 different antibiotics for 2 weeks each.  I do encourage you to really start the Protonix, if it makes you sleepy, please take it at bedtime.  I encourage you to hold your Synthroid for the next 2 weeks, we will recheck your thyroid function at that office visit.  You are going to start medication to help your cholesterol levels.  I do encourage you to also follow a low-cholesterol diet.  The medication is called atorvastatin, you will take this at bedtime.  Please return to the mobile unit in 2 weeks for further follow-up.  Roney Jaffe, PA-C Physician Assistant [facility] Mobile Medicine [email or web address]   Infeccin por Helicobacter Pylori Helicobacter Pylori Infection La infeccin por Helicobacter pylori es una infeccin bacteriana en el estmago. Las infecciones a Air cabin crew (crnicas) pueden causar irritacin en el estmago (gastritis), lceras en el estmago (lceras gstricas) y lceras en la parte superior del intestino (lceras duodenales). Tener esta infeccin tambin puede aumentar su riesgo de Geneticist, molecular de estmago y un tipo de cncer de los glbulos blancos (linfoma) que afecta al [employer]. Cules son las causas? Esta infeccin es causada por la bacteria Helicobacter pylori (H. pylori). Muchas personas saludables tienen esta bacteria en el revestimiento del estmago. Adems, la bacteria puede contagiarse de Neomia Dear persona a otra por contacto a travs de la materia fecal (heces) o la saliva. No se sabe por qu algunas personas desarrollan lceras,gastritis o cncer a partir de la bacteria. Qu incrementa el riesgo? Es ms probable que tenga esta afeccin si: Tiene familiares con esta infeccin. Convive con [employer], como en un dormitorio. Es de origen africano, hispano o asitico. Cules son los signos o sntomas? La mayora de las personas con esta  infeccin no tienen sntomas. Si tiene sntomas, pueden incluir los siguientes: Merchant navy officer. Dolor de [place]. Nuseas. Vmitos. Puede presentar sangre en el vmito a causa de las lceras. Prdida del apetito. Mal aliento. Cmo se diagnostica? Esta afeccin se puede diagnosticar en funcin de lo siguiente: Los sntomas y los antecedentes mdicos. Un examen fsico. Anlisis de sangre. Pruebas de heces. Una prueba del aliento. Un procedimiento que consiste en colocarle un tubo con una cmara en el extremo por la garganta para examinarle el estmago y la parte superior del intestino (endoscopa alta). Extraccin y [place] de [place] de tejido del revestimiento del estmago (biopsia). Una biopsia puede realizarse durante una endoscopa alta. Cmo se trata?  Esta afeccin se trata mediante una combinacin de medicamentos (terapia triple) durante varias semanas. La terapia triple incluye un medicamento para reducir la cantidad de cido en el estmago y [place] tipos de antibiticos. Estetratamiento puede reducir Nurse, adult de Geneticist, molecular. Despus del tratamiento, es posible que deba volver a realizarse una prueba de H. pylori. En algunos casos, es posible que el tratamiento se deba repetir si no haeliminado todas las bacterias. Siga estas instrucciones en su casa:  Use los medicamentos de venta libre y los recetados solamente como se lo haya indicado el mdico. Tome los antibiticos como se lo haya indicado el mdico. No deje de tomar los antibiticos aunque comience a sentirse mejor. Retome sus actividades normales como se lo haya indicado el mdico. Pregntele al mdico qu actividades son seguras para usted. Tome estas medidas para evitar infecciones futuras: Lvese las manos frecuentemente con agua y [place]. Use desinfectante para manos si no dispone de [place] y [place]. No coma alimentos ni beba agua  que pueda haber estado en contacto con heces o saliva. Concurra a todas las visitas  de [address] se lo haya indicado el mdico. Esto es importante. Es posible que necesite realizarse estudios para comprobar que el tratamiento funcion. Comunquese con un mdico si sus sntomas: No mejoran con el tratamiento. Regresan despus del tratamiento. Resumen La infeccin por Helicobacter pylori es una infeccin estomacal causada por la bacteria Helicobacter pylori (H. pylori). Esta infeccin puede causar irritacin en el estmago (gastritis), lceras en el estmago (lceras gstricas) y lceras en la parte superior del intestino (lceras duodenales). Esta afeccin se trata mediante una combinacin de medicamentos (terapia triple) durante varias semanas. Tome los antibiticos como se lo haya indicado el mdico. No deje de tomar los antibiticos aunque comience a sentirse mejor. Esta informacin no tiene Theme park manager el consejo del mdico. Asegresede hacerle al mdico cualquier pregunta que tenga. Document Revised: 07/24/2020 Document Reviewed: 07/24/2020 Elsevier Patient Education  2022 [employer].

## 2021-02-25 NOTE — Progress Notes (Signed)
Established Patient Office Visit  Subjective:  Patient ID: Natasha Roberts, female    DOB: [birth year]  Age: 60 y.o. MRN: [id number]  CC:  Chief Complaint  Patient presents with   Abdominal Pain     HPI [name] reports that she continues to have abdominal discomfort.  Reports that she did not start the medications, states she was unaware that medications were sent for her.  Reports that she did stop taking the Protonix, states that she felt it was making her feel sleepy.  Reports that she has been continuing to take 100 mcg of Synthroid.  Reports that she had only previously been prescribed a lower dose, she believes it was in the 70s, however she was unable to get it refilled so she was able to find this dose on her own  Due to language barrier, an interpreter was present during the history-taking and subsequent discussion (and for part of the physical exam) with this patient.   Past Medical History:  Diagnosis Date   Thyroid disease hypothyroidism    History reviewed. No pertinent surgical history.  History reviewed. No pertinent family history.  Social History   Socioeconomic History   Marital status: Single    Spouse name: Not on file   Number of children: Not on file   Years of education: Not on file   Highest education level: Not on file  Occupational History   Not on file  Tobacco Use   Smoking status: Some Days   Smokeless tobacco: Never  Substance and Sexual Activity   Alcohol use: Never   Drug use: Never   Sexual activity: Not on file  Other Topics Concern   Not on file  Social History Narrative   Not on file   Social Determinants of Health   Financial Resource Strain: Not on file  Food Insecurity: Not on file  Transportation Needs: Not on file  Physical Activity: Not on file  Stress: Not on file  Social Connections: Not on file  Intimate Partner Violence: Not on file    Outpatient Medications Prior to Visit  Medication Sig Dispense Refill    acetaminophen (TYLENOL) 325 MG tablet Take 2 tablets (650 mg total) by mouth every 6 (six) hours as needed for mild pain (or Fever >/= 101).     amoxicillin (AMOXIL) 500 MG capsule Take 2 capsules (1,000 mg total) by mouth 2 (two) times daily for 14 days. 56 capsule 0   Ascorbic Acid (VITAMIN C) 1000 MG tablet Take 1,000 mg by mouth daily.     atorvastatin (LIPITOR) 10 MG tablet Take 1 tablet (10 mg total) by mouth daily. 90 tablet 3   B Complex-C (B-COMPLEX WITH VITAMIN C) tablet Take 1 tablet by mouth daily.     clarithromycin (BIAXIN) 500 MG tablet Take 1 tablet (500 mg total) by mouth 2 (two) times daily for 14 days. 28 tablet 0   glucosamine-chondroitin 500-400 MG tablet Take 1 tablet by mouth daily.     levothyroxine (SYNTHROID) 100 MCG tablet Take 100 mcg by mouth daily before breakfast.     Multiple Vitamin (MULTIVITAMIN) tablet Take 1 tablet by mouth daily.     ondansetron (ZOFRAN ODT) 8 MG disintegrating tablet Take 1 tablet (8 mg total) by mouth every 8 (eight) hours as needed for nausea or vomiting. 20 tablet 0   pantoprazole (PROTONIX) 40 MG tablet Take 1 tablet (40 mg total) by mouth daily. 30 tablet 3   No facility-administered medications prior to visit.  No Known Allergies  ROS Review of Systems  Constitutional: Negative.   HENT: Negative.    Eyes: Negative.   Respiratory:  Negative for shortness of breath.   Cardiovascular:  Negative for chest pain.  Gastrointestinal:  Positive for abdominal pain. Negative for diarrhea, nausea and vomiting.  Endocrine: Negative.   Genitourinary: Negative.   Musculoskeletal: Negative.   Skin: Negative.   Allergic/Immunologic: Negative.   Neurological: Negative.   Hematological: Negative.   Psychiatric/Behavioral: Negative.       Objective:    Physical Exam Vitals and nursing note reviewed.  Constitutional:      Appearance: Normal appearance.  HENT:     Head: Normocephalic and atraumatic.     Right Ear: External ear  normal.     Left Ear: External ear normal.     Nose: Nose normal.     Mouth/Throat:     Mouth: Mucous membranes are moist.     Pharynx: Oropharynx is clear.  Eyes:     Extraocular Movements: Extraocular movements intact.     Conjunctiva/sclera: Conjunctivae normal.     Pupils: Pupils are equal, round, and reactive to light.  Cardiovascular:     Rate and Rhythm: Normal rate and regular rhythm.     Pulses: Normal pulses.     Heart sounds: Normal heart sounds.  Pulmonary:     Effort: Pulmonary effort is normal.     Breath sounds: Normal breath sounds.  Abdominal:     General: Abdomen is flat.     Palpations: Abdomen is soft.     Tenderness: There is abdominal tenderness in the epigastric area.  Musculoskeletal:        General: Normal range of motion.     Cervical back: Normal range of motion and neck supple.  Skin:    General: Skin is warm and dry.  Neurological:     General: No focal deficit present.     Mental Status: She is alert and oriented to person, place, and time.  Psychiatric:        Mood and Affect: Mood normal.        Behavior: Behavior normal.        Thought Content: Thought content normal.        Judgment: Judgment normal.    BP 123/84 (BP Location: Left Arm, Patient Position: Sitting, Cuff Size: Normal)   Pulse 96   Temp 98.8 F (37.1 C) (Oral)   Resp 18   Ht '5\' 2"'  (1.575 m)   Wt 135 lb (61.2 kg)   SpO2 97%   BMI 24.69 kg/m  Wt Readings from Last 3 Encounters:  02/25/21 135 lb (61.2 kg)  01/26/21 128 lb (58.1 kg)  01/01/21 120 lb (54.4 kg)     Health Maintenance Due  Topic Date Due   COVID-19 Vaccine (1) Never done   Pneumococcal Vaccine 36-48 Years old (1 - PCV) Never done   TETANUS/TDAP  Never done   PAP SMEAR-Modifier  Never done   COLONOSCOPY (Pts 45-45yr Insurance coverage will need to be confirmed)  Never done   MAMMOGRAM  Never done   Zoster Vaccines- Shingrix (1 of 2) Never done    There are no preventive care reminders to display  for this patient.  Lab Results  Component Value Date   TSH 0.069 (L) 01/27/2021   Lab Results  Component Value Date   WBC 5.8 01/27/2021   HGB 14.5 01/27/2021   HCT 44.1 01/27/2021   MCV 95 01/27/2021  PLT 201 01/27/2021   Lab Results  Component Value Date   NA 141 01/27/2021   K 4.2 01/27/2021   CO2 21 (L) 01/03/2021   GLUCOSE 107 (H) 01/27/2021   BUN 10 01/27/2021   CREATININE 0.59 01/27/2021   BILITOT 0.3 01/27/2021   ALKPHOS 93 01/27/2021   AST 18 01/27/2021   ALT 132 (H) 01/03/2021   PROT 6.9 01/27/2021   ALBUMIN 4.6 01/27/2021   CALCIUM 9.3 01/27/2021   ANIONGAP 10 01/03/2021   EGFR 104 01/27/2021   Lab Results  Component Value Date   CHOL 221 (H) 01/27/2021   Lab Results  Component Value Date   HDL 41 01/27/2021   Lab Results  Component Value Date   LDLCALC 135 (H) 01/27/2021   Lab Results  Component Value Date   TRIG 251 (H) 01/27/2021   Lab Results  Component Value Date   CHOLHDL 5.4 (H) 01/27/2021   No results found for: HGBA1C    Assessment & Plan:   Problem List Items Addressed This Visit       Endocrine   Hypothyroidism     Other   Abdominal pain, epigastric   Other Visit Diagnoses     H. pylori infection    -  Primary   Mixed hyperlipidemia           No orders of the defined types were placed in this encounter. 1. H. pylori infection Patient encouraged to begin treatment that was sent to her pharmacy.  Patient encouraged to resume Protonix, try taking it at bedtime to see if this helps alleviate the drowsiness.  Continue to follow GERD lifestyle modifications  2. Abdominal pain, epigastric   3. Other specified hypothyroidism Patient has been taking much higher dose of Synthroid, patient was subclinical hyperthyroid 4 weeks ago.  Encouraged patient to hold Synthroid for 2 weeks and return to office for follow-up and repeat labs.  4. Mixed hyperlipidemia Patient was fasting for labs.  Not previously treated for elevated  cholesterol.  The 10-year ASCVD risk score Mikey Bussing DC Brooke Bonito., et al., 2013) is: 7.5%   Values used to calculate the score:     Age: 70 years     Sex: Female     Is Non-Hispanic African American: No     Diabetic: No     Tobacco smoker: Yes     Systolic Blood Pressure: 128 mmHg     Is BP treated: No     HDL Cholesterol: 41 mg/dL     Total Cholesterol: 221 mg/dL  Patient encouraged to begin atorvastatin, prescription previously sent to her pharmacy.  Patient encouraged to follow a low-cholesterol diet.   I have reviewed the patient's medical history (PMH, PSH, Social History, Family History, Medications, and allergies) , and have been updated if relevant. I spent 32 minutes reviewing chart and  face to face time with patient.   Follow-up: Return in about 2 weeks (around 03/11/2021) for MMU.    Loraine Grip Mayers, PA-C

## 2021-02-25 NOTE — Progress Notes (Signed)
Patient has taken medication today. Patient has eaten today. Patient denies pain at this time. Patient presents to discuss labs and next step.

## 2021-02-26 DIAGNOSIS — A048 Other specified bacterial intestinal infections: Secondary | ICD-10-CM | POA: Insufficient documentation

## 2021-02-26 DIAGNOSIS — E782 Mixed hyperlipidemia: Secondary | ICD-10-CM | POA: Insufficient documentation

## 2021-05-01 ENCOUNTER — Ambulatory Visit: Payer: 59 | Attending: Internal Medicine | Admitting: Internal Medicine

## 2021-05-01 ENCOUNTER — Encounter (INDEPENDENT_AMBULATORY_CARE_PROVIDER_SITE_OTHER): Payer: Self-pay

## 2021-05-01 ENCOUNTER — Encounter: Payer: Self-pay | Admitting: Internal Medicine

## 2021-05-01 ENCOUNTER — Other Ambulatory Visit: Payer: Self-pay

## 2021-05-01 VITALS — BP 126/86 | HR 92 | Resp 16 | Ht 62.0 in | Wt 140.0 lb

## 2021-05-01 DIAGNOSIS — E782 Mixed hyperlipidemia: Secondary | ICD-10-CM | POA: Diagnosis not present

## 2021-05-01 DIAGNOSIS — Z7689 Persons encountering health services in other specified circumstances: Secondary | ICD-10-CM

## 2021-05-01 DIAGNOSIS — Z1211 Encounter for screening for malignant neoplasm of colon: Secondary | ICD-10-CM

## 2021-05-01 DIAGNOSIS — Z1231 Encounter for screening mammogram for malignant neoplasm of breast: Secondary | ICD-10-CM

## 2021-05-01 DIAGNOSIS — E039 Hypothyroidism, unspecified: Secondary | ICD-10-CM

## 2021-05-01 DIAGNOSIS — Z23 Encounter for immunization: Secondary | ICD-10-CM

## 2021-05-01 NOTE — Progress Notes (Signed)
Patient ID: Natasha Roberts, female    DOB: 24-Mar-1961  MRN: [id number]  CC: New Patient (Initial Visit)   Subjective: Natasha Roberts is a 60 y.o. female who presents for new pt Her concerns today include: Pt with hypothyroid, HL  Pt with hx of hypothyroid.  On Levothyroxine 100 mcg daily.  [place] from [place]  During hosp 4 mths ago, TSH level 0.277.  Pt subsequently saw PA Mayers in June and reports being told to hold med for 2-3 wks which she did. Then she restarted on same dose.  Repeat level 0.069.  Then told to stop med completely.  Pt decided not to do so because she felt bad -reports being given iodine therapy 13 yrs ago for over functioning thyroid.  "They burn the gland."  -reports no recent wgh changes.  Endorses frequent palpitations, hot and cold feelings daily, hair loss  On Lipitor for cholesterol.  Tolerating okay.  Would like levels recheck today  HM:  due for flu shot, MMG and colon CA screen.  Had Hysterectomy 15 yrs ago for precancerous lesion.   Patient Active Problem List   Diagnosis Date Noted   H. pylori infection 02/26/2021   Mixed hyperlipidemia 02/26/2021   Thrombocytopenia (HCC) 01/28/2021   Hypothyroidism 01/26/2021   Abdominal pain, epigastric 01/26/2021   Nausea without vomiting 01/26/2021   Elevated liver enzymes 01/26/2021     Current Outpatient Medications on File Prior to Visit  Medication Sig Dispense Refill   acetaminophen (TYLENOL) 325 MG tablet Take 2 tablets (650 mg total) by mouth every 6 (six) hours as needed for mild pain (or Fever >/= 101).     atorvastatin (LIPITOR) 10 MG tablet Take 1 tablet (10 mg total) by mouth daily. 90 tablet 3   Ascorbic Acid (VITAMIN C) 1000 MG tablet Take 1,000 mg by mouth daily. (Patient not taking: Reported on 05/01/2021)     B Complex-C (B-COMPLEX WITH VITAMIN C) tablet Take 1 tablet by mouth daily. (Patient not taking: Reported on 05/01/2021)     glucosamine-chondroitin 500-400 MG tablet  Take 1 tablet by mouth daily. (Patient not taking: Reported on 05/01/2021)     levothyroxine (SYNTHROID) 100 MCG tablet Take 100 mcg by mouth daily before breakfast. (Patient not taking: Reported on 05/01/2021)     Multiple Vitamin (MULTIVITAMIN) tablet Take 1 tablet by mouth daily. (Patient not taking: Reported on 05/01/2021)     ondansetron (ZOFRAN ODT) 8 MG disintegrating tablet Take 1 tablet (8 mg total) by mouth every 8 (eight) hours as needed for nausea or vomiting. (Patient not taking: Reported on 05/01/2021) 20 tablet 0   pantoprazole (PROTONIX) 40 MG tablet Take 1 tablet (40 mg total) by mouth daily. (Patient not taking: Reported on 05/01/2021) 30 tablet 3   No current facility-administered medications on file prior to visit.    No Known Allergies  Social History   Socioeconomic History   Marital status: Single    Spouse name: Not on file   Number of children: Not on file   Years of education: Not on file   Highest education level: Not on file  Occupational History   Not on file  Tobacco Use   Smoking status: Some Days   Smokeless tobacco: Never  Substance and Sexual Activity   Alcohol use: Never   Drug use: Never   Sexual activity: Not on file  Other Topics Concern   Not on file  Social History Narrative   Not on  file   Social Determinants of Health   Financial Resource Strain: Not on file  Food Insecurity: Not on file  Transportation Needs: Not on file  Physical Activity: Not on file  Stress: Not on file  Social Connections: Not on file  Intimate Partner Violence: Not on file    No family history on file.  No past surgical history on file.  ROS: Review of Systems Negative except as stated above  PHYSICAL EXAM: BP 126/86   Pulse 92   Resp 16   Ht 5\' 2"  (1.575 m)   Wt 140 lb (63.5 kg)   SpO2 97%   BMI 25.61 kg/m   Wt Readings from Last 3 Encounters:  05/01/21 140 lb (63.5 kg)  02/25/21 135 lb (61.2 kg)  01/26/21 128 lb (58.1 kg)    Physical  Exam  General appearance - alert, well appearing, middle-aged Hispanic female and in no distress Mental status - normal mood, behavior, speech, dress, motor activity, and thought processes Mouth - mucous membranes moist, pharynx normal without lesions Neck - supple, no significant adenopathy Chest - clear to auscultation, no wheezes, rales or rhonchi, symmetric air entry Heart - normal rate, regular rhythm, normal S1, S2, no murmurs, rubs, clicks or gallops Extremities - peripheral pulses normal, no pedal edema, no clubbing or cyanosis   CMP Latest Ref Rng & Units 01/27/2021 01/03/2021 01/02/2021  Glucose 65 - 99 mg/dL 01/04/2021) 948(N) 462(V)  BUN 6 - 24 mg/dL 10 035(K) <0(X)  Creatinine 0.57 - 1.00 mg/dL <3(G 1.82 9.93  Sodium 134 - 144 mmol/L 141 139 136  Potassium 3.5 - 5.2 mmol/L 4.2 3.7 3.1(L)  Chloride 96 - 106 mmol/L 106 108 103  CO2 22 - 32 mmol/L - 21(L) 25  Calcium 8.7 - 10.2 mg/dL 9.3 7.16) 8.1(L)  Total Protein 6.0 - 8.5 g/dL 6.9 9.6(V) -  Total Bilirubin 0.0 - 1.2 mg/dL 0.3 0.7 -  Alkaline Phos 44 - 121 IU/L 93 211(H) -  AST 0 - 40 IU/L 18 111(H) -  ALT 0 - 44 U/L - 132(H) -   Lipid Panel     Component Value Date/Time   CHOL 221 (H) 01/27/2021 0920   TRIG 251 (H) 01/27/2021 0920   HDL 41 01/27/2021 0920   CHOLHDL 5.4 (H) 01/27/2021 0920   LDLCALC 135 (H) 01/27/2021 0920    CBC    Component Value Date/Time   WBC 5.8 01/27/2021 0920   WBC 6.1 01/03/2021 0028   RBC 4.65 01/27/2021 0920   RBC 4.43 01/03/2021 0028   HGB 14.5 01/27/2021 0920   HCT 44.1 01/27/2021 0920   PLT 201 01/27/2021 0920   MCV 95 01/27/2021 0920   MCH 31.2 01/27/2021 0920   MCH 31.8 01/03/2021 0028   MCHC 32.9 01/27/2021 0920   MCHC 34.3 01/03/2021 0028   RDW 13.6 01/27/2021 0920   LYMPHSABS 2.8 01/27/2021 0920   MONOABS 0.1 12/31/2020 2009   EOSABS 0.1 01/27/2021 0920   BASOSABS 0.1 01/27/2021 0920    ASSESSMENT AND PLAN: 1. Encounter to establish care   2. Acquired  hypothyroidism Advised patient that some of her symptoms suggest she may be on too higher dose of the levothyroxine.  We will check levels today and I will get back with her with further instructions. - TSH+T4F+T3Free - Ambulatory referral to Endocrinology  3. Mixed hyperlipidemia Continue atorvastatin. - Lipid panel  4. Encounter for screening mammogram for malignant neoplasm of breast - MM Digital Screening; Future  5. Screening  for colon cancer Discussed colon cancer screening methods.  Patient prefers to have colonoscopy. - Ambulatory referral to Gastroenterology  6. Need for immunization against influenza - Flu Vaccine QUAD 2mo+IM (Fluarix, Fluzone & Alfiuria Quad PF)    Patient was given the opportunity to ask questions.  Patient verbalized understanding of the plan and was able to repeat key elements of the plan.  [employer] Language interpreter used during this encounter. #[id number], Ivonne  Orders Placed This Encounter  Procedures   MM Digital Screening   Flu Vaccine QUAD 95mo+IM (Fluarix, Fluzone & Alfiuria Quad PF)   TSH+T4F+T3Free   Lipid panel   Ambulatory referral to Endocrinology   Ambulatory referral to Gastroenterology     Requested Prescriptions    No prescriptions requested or ordered in this encounter    Return if symptoms worsen or fail to improve.  Jonah Blue, MD, FACP

## 2021-05-02 LAB — LIPID PANEL
Chol/HDL Ratio: 3.9 ratio (ref 0.0–4.4)
Cholesterol, Total: 160 mg/dL (ref 100–199)
HDL: 41 mg/dL (ref >39)
LDL Chol Calc (NIH): 78 mg/dL (ref 0–99)
Triglycerides: 248 mg/dL — ABNORMAL HIGH (ref 0–149)
VLDL Cholesterol Cal: 41 mg/dL — ABNORMAL HIGH (ref 5–40)

## 2021-05-02 LAB — TSH+T4F+T3FREE
Free T4: 1.37 ng/dL (ref 0.82–1.77)
T3, Free: 2.9 pg/mL (ref 2.0–4.4)
TSH: 2.08 u[IU]/mL (ref 0.450–4.500)

## 2021-05-05 ENCOUNTER — Telehealth: Payer: Self-pay

## 2021-05-05 NOTE — Telephone Encounter (Signed)
Contacted pt to go over lab results pt didn't answer lvm asking pt to give me a call at her/his earliest convenience   Sent a CRM and forward labs to NT to give pt labs when they call back    Interpreter used was Raquel and ID number [phone]

## 2021-06-07 ENCOUNTER — Encounter: Payer: Self-pay | Admitting: Internal Medicine

## 2021-06-11 ENCOUNTER — Ambulatory Visit: Payer: 59

## 2021-08-25 ENCOUNTER — Telehealth (HOSPITAL_BASED_OUTPATIENT_CLINIC_OR_DEPARTMENT_OTHER): Payer: 59 | Admitting: Nurse Practitioner

## 2021-08-25 ENCOUNTER — Encounter: Payer: Self-pay | Admitting: Nurse Practitioner

## 2021-08-25 DIAGNOSIS — E782 Mixed hyperlipidemia: Secondary | ICD-10-CM

## 2021-08-25 DIAGNOSIS — E039 Hypothyroidism, unspecified: Secondary | ICD-10-CM

## 2021-08-25 DIAGNOSIS — R195 Other fecal abnormalities: Secondary | ICD-10-CM | POA: Diagnosis not present

## 2021-08-25 DIAGNOSIS — R5382 Chronic fatigue, unspecified: Secondary | ICD-10-CM | POA: Diagnosis not present

## 2021-08-25 DIAGNOSIS — Z1211 Encounter for screening for malignant neoplasm of colon: Secondary | ICD-10-CM

## 2021-08-25 MED ORDER — LOPERAMIDE HCL 2 MG PO TABS: 2.0000 mg | ORAL_TABLET | Freq: Four times a day (QID) | ORAL | 0 refills | Status: DC | PRN

## 2021-08-25 NOTE — Progress Notes (Signed)
Virtual Visit via Telephone Note Due to national recommendations of social distancing due to [place] 19, telehealth visit is felt to be most appropriate for this patient at this time.  I discussed the limitations, risks, security and privacy concerns of performing an evaluation and management service by telephone and the availability of in person appointments. I also discussed with the patient that there may be a patient responsible charge related to this service. The patient expressed understanding and agreed to proceed.    I connected with Natasha Roberts on 08/25/21  at   3:50 PM EST  EDT by telephone and verified that I am speaking with the correct person using two identifiers.  Location of Patient: Private Residence   Location of Provider: [place] participating in Telemedicine visit: Geryl Rankins FNP-BC [place] [phone]   History of Present Illness: Telemedicine visit for: Fatigue and loose stools She has a past medical history of Thyroid disease (hypothyroidism) and hyperlipidemia  Notes fatigue and decreased energy started 2 months ago. Over the past 2 weeks she endorses loose stools occurring after meals. States "I just feel bad".  Denies fever, cough, sore throat or headache.  She is currently taking thyroid and cholesterol medication. Denies starting any new medications recently.  COVID test negative. Last TSH normal Lab Results  Component Value Date   TSH 2.080 05/01/2021    Past Medical History:  Diagnosis Date   Thyroid disease hypothyroidism    Past Surgical History:  Procedure Laterality Date   ABDOMINAL HYSTERECTOMY      No family history on file.  Social History   Socioeconomic History   Marital status: Single    Spouse name: Not on file   Number of children: Not on file   Years of education: Not on file   Highest education level: Not on file  Occupational History   Not on file   Tobacco Use   Smoking status: Some Days   Smokeless tobacco: Never  Substance and Sexual Activity   Alcohol use: Never   Drug use: Never   Sexual activity: Not on file  Other Topics Concern   Not on file  Social History Narrative   Not on file   Social Determinants of Health   Financial Resource Strain: Not on file  Food Insecurity: Not on file  Transportation Needs: Not on file  Physical Activity: Not on file  Stress: Not on file  Social Connections: Not on file     Observations/Objective: Awake, alert and oriented x 3   Review of Systems  Constitutional:  Positive for malaise/fatigue. Negative for chills, diaphoresis, fever and weight loss.  HENT: Negative.  Negative for nosebleeds.   Eyes: Negative.  Negative for blurred vision, double vision and photophobia.  Respiratory: Negative.  Negative for cough and shortness of breath.   Cardiovascular: Negative.  Negative for chest pain, palpitations and leg swelling.  Gastrointestinal:  Positive for diarrhea. Negative for abdominal pain, blood in stool, constipation, heartburn, melena, nausea and vomiting.  Musculoskeletal: Negative.  Negative for myalgias.  Neurological: Negative.  Negative for dizziness, focal weakness, seizures and headaches.  Psychiatric/Behavioral: Negative.  Negative for suicidal ideas.    Assessment and Plan: Diagnoses and all orders for this visit:  Chronic fatigue -     CMP14+EGFR -     CBC with Differential  Colon cancer screening -     Fecal occult blood, imunochemical([facility])  Loose stools -  loperamide (IMODIUM A-D) 2 MG tablet; Take 1 tablet (2 mg total) by mouth 4 (four) times daily as needed for diarrhea or loose stools.  Acquired hypothyroidism -     Thyroid Panel With TSH  Mixed hyperlipidemia -     Lipid panel     Follow Up Instructions Return if symptoms worsen or fail to improve.     I discussed the assessment and treatment plan with the patient. The patient  was provided an opportunity to ask questions and all were answered. The patient agreed with the plan and demonstrated an understanding of the instructions.   The patient was advised to call back or seek an in-person evaluation if the symptoms worsen or if the condition fails to improve as anticipated.  I provided 14 minutes of non-face-to-face time during this encounter including median intraservice time, reviewing previous notes, labs, imaging, medications and explaining diagnosis and management.  Gildardo Pounds, FNP-BC

## 2021-08-26 LAB — LIPID PANEL
Chol/HDL Ratio: 3.7 ratio (ref 0.0–4.4)
Cholesterol, Total: 176 mg/dL (ref 100–199)
HDL: 48 mg/dL (ref >39)
LDL Chol Calc (NIH): 81 mg/dL (ref 0–99)
Triglycerides: 289 mg/dL — ABNORMAL HIGH (ref 0–149)
VLDL Cholesterol Cal: 47 mg/dL — ABNORMAL HIGH (ref 5–40)

## 2021-08-26 LAB — CMP14+EGFR
ALT: 17 IU/L (ref 0–32)
AST: 19 IU/L (ref 0–40)
Albumin/Globulin Ratio: 1.7 (ref 1.2–2.2)
Albumin: 4.4 g/dL (ref 3.8–4.9)
Alkaline Phosphatase: 71 IU/L (ref 44–121)
BUN/Creatinine Ratio: 15 (ref 9–23)
BUN: 11 mg/dL (ref 6–24)
Bilirubin Total: 0.3 mg/dL (ref 0.0–1.2)
CO2: 25 mmol/L (ref 20–29)
Calcium: 9.6 mg/dL (ref 8.7–10.2)
Chloride: 104 mmol/L (ref 96–106)
Creatinine, Ser: 0.74 mg/dL (ref 0.57–1.00)
Globulin, Total: 2.6 g/dL (ref 1.5–4.5)
Glucose: 88 mg/dL (ref 70–99)
Potassium: 4.3 mmol/L (ref 3.5–5.2)
Sodium: 142 mmol/L (ref 134–144)
Total Protein: 7 g/dL (ref 6.0–8.5)
eGFR: 93 mL/min/{1.73_m2} (ref >59)

## 2021-08-26 LAB — CBC WITH DIFFERENTIAL/PLATELET
Basophils Absolute: 0.1 10*3/uL (ref 0.0–0.2)
Basos: 1 %
EOS (ABSOLUTE): 0.1 10*3/uL (ref 0.0–0.4)
Eos: 2 %
Hematocrit: 41.7 % (ref 34.0–46.6)
Hemoglobin: 13.9 g/dL (ref 11.1–15.9)
Immature Grans (Abs): 0 10*3/uL (ref 0.0–0.1)
Immature Granulocytes: 0 %
Lymphocytes Absolute: 4 10*3/uL — ABNORMAL HIGH (ref 0.7–3.1)
Lymphs: 51 %
MCH: 31.3 pg (ref 26.6–33.0)
MCHC: 33.3 g/dL (ref 31.5–35.7)
MCV: 94 fL (ref 79–97)
Monocytes Absolute: 0.5 10*3/uL (ref 0.1–0.9)
Monocytes: 6 %
Neutrophils Absolute: 3.1 10*3/uL (ref 1.4–7.0)
Neutrophils: 40 %
Platelets: 261 10*3/uL (ref 150–450)
RBC: 4.44 x10E6/uL (ref 3.77–5.28)
RDW: 13.6 % (ref 11.7–15.4)
WBC: 7.9 10*3/uL (ref 3.4–10.8)

## 2021-08-26 LAB — THYROID PANEL WITH TSH
Free Thyroxine Index: 1.5 (ref 1.2–4.9)
T3 Uptake Ratio: 25 % (ref 24–39)
T4, Total: 5.8 ug/dL (ref 4.5–12.0)
TSH: 26.7 u[IU]/mL — ABNORMAL HIGH (ref 0.450–4.500)

## 2021-08-28 LAB — FECAL OCCULT BLOOD, IMMUNOCHEMICAL: Fecal Occult Bld: NEGATIVE

## 2021-08-30 ENCOUNTER — Encounter: Payer: Self-pay | Admitting: Nurse Practitioner

## 2021-08-31 ENCOUNTER — Other Ambulatory Visit: Payer: Self-pay | Admitting: Nurse Practitioner

## 2021-08-31 DIAGNOSIS — E039 Hypothyroidism, unspecified: Secondary | ICD-10-CM

## 2021-08-31 MED ORDER — LEVOTHYROXINE SODIUM 100 MCG PO TABS: 100.0000 ug | ORAL_TABLET | Freq: Every day | ORAL | 1 refills | Status: DC

## 2021-08-31 NOTE — Telephone Encounter (Signed)
Spoke with Ms. Tosi. She is aware she needs to return to lab in 3 more weeks for repeat Thyroid. At this time she will continue on synthroid 100 mg daily. She is still waiting to here from Endocrinology

## 2021-10-05 ENCOUNTER — Telehealth: Payer: Self-pay

## 2021-10-05 DIAGNOSIS — E039 Hypothyroidism, unspecified: Secondary | ICD-10-CM

## 2021-10-05 NOTE — Telephone Encounter (Signed)
Copied from [place] [id number]. Topic: General - Other >> Oct 05, 2021  4:12 PM Pawlus, Brayton Layman A wrote: Reason for CRM: Pt called in with some questions regarding labwork, pt stated she is supposed to get labs done before her appt on 3/20, please advise.

## 2021-10-08 NOTE — Telephone Encounter (Signed)
Pt scheduled labs for 10/13/2021 at 4:45pm, and confirmed that she is taking 100 MCG of Levothryoxine

## 2021-10-08 NOTE — Telephone Encounter (Signed)
Pt has a follow up with Dr. Laural Benes on 11/02/21 for thyroid and she has a appt with Endo on 3/31. Pt was suppose to come back for labs 3 weeks after first lab check. Would pt just need to come get labs checked and follow up with Endo or would you like for pt to come get labs and follow up with you on 3/21.

## 2021-10-08 NOTE — Telephone Encounter (Signed)
Will forward to provider to make aware  

## 2021-10-08 NOTE — Addendum Note (Signed)
Addended by: Karle Plumber B on: 10/08/2021 11:16 AM   Modules accepted: Orders

## 2021-10-13 ENCOUNTER — Other Ambulatory Visit: Payer: Self-pay

## 2021-10-13 ENCOUNTER — Ambulatory Visit: Payer: 59 | Attending: Internal Medicine

## 2021-10-13 DIAGNOSIS — E039 Hypothyroidism, unspecified: Secondary | ICD-10-CM

## 2021-10-14 ENCOUNTER — Other Ambulatory Visit: Payer: Self-pay | Admitting: Internal Medicine

## 2021-10-14 DIAGNOSIS — E039 Hypothyroidism, unspecified: Secondary | ICD-10-CM

## 2021-10-14 LAB — TSH+T4F+T3FREE
Free T4: 1.59 ng/dL (ref 0.82–1.77)
T3, Free: 3 pg/mL (ref 2.0–4.4)
TSH: 0.164 u[IU]/mL — ABNORMAL LOW (ref 0.450–4.500)

## 2021-10-14 MED ORDER — LEVOTHYROXINE SODIUM 88 MCG PO TABS: 88.0000 ug | ORAL_TABLET | Freq: Every day | ORAL | 1 refills | Status: DC

## 2021-10-25 ENCOUNTER — Other Ambulatory Visit: Payer: Self-pay | Admitting: Nurse Practitioner

## 2021-10-25 DIAGNOSIS — E039 Hypothyroidism, unspecified: Secondary | ICD-10-CM

## 2021-10-26 NOTE — Telephone Encounter (Signed)
rx was sent in 10/14/21 #30/1 ? ?Requested Prescriptions  ?Pending Prescriptions Disp Refills  ?? levothyroxine (SYNTHROID) 100 MCG tablet [Pharmacy Med Name: LEVOTHYROXINE 0.100MG  ( ) TAB] 30 tablet 1  ?  Sig: TAKE 1 TABLET(100 MCG) BY MOUTH DAILY BEFORE BREAKFAST  ?  ? Endocrinology:  Hypothyroid Agents Failed - 10/25/2021  4:06 AM  ?  ?  Failed - TSH in normal range and within 360 days  ?  TSH  ?Date Value Ref Range Status  ?10/13/2021 0.164 (L) 0.450 - 4.500 uIU/mL Final  ?   ?  ?  Passed - Valid encounter within last 12 months  ?  Recent Outpatient Visits   ?      ? 2 months ago Chronic fatigue  ? [facility] [place], Shea Stakes, NP  ? 5 months ago Encounter to establish care  ? [facility] Marcine Matar, MD  ?  ?  ? ?  ?  ?  ? ? ?

## 2021-11-02 ENCOUNTER — Ambulatory Visit: Payer: 59 | Admitting: Internal Medicine

## 2021-11-13 ENCOUNTER — Encounter: Payer: Self-pay | Admitting: Internal Medicine

## 2021-11-13 ENCOUNTER — Ambulatory Visit: Payer: 59 | Admitting: Internal Medicine

## 2021-11-13 VITALS — BP 132/98 | HR 96 | Ht 62.0 in | Wt 138.8 lb

## 2021-11-13 DIAGNOSIS — E039 Hypothyroidism, unspecified: Secondary | ICD-10-CM

## 2021-11-13 DIAGNOSIS — E890 Postprocedural hypothyroidism: Secondary | ICD-10-CM | POA: Diagnosis not present

## 2021-11-13 DIAGNOSIS — R5383 Other fatigue: Secondary | ICD-10-CM

## 2021-11-13 LAB — T4, FREE: Free T4: 0.84 ng/dL (ref 0.60–1.60)

## 2021-11-13 LAB — VITAMIN D 25 HYDROXY (VIT D DEFICIENCY, FRACTURES): VITD: 24.68 ng/mL — ABNORMAL LOW (ref 30.00–100.00)

## 2021-11-13 LAB — VITAMIN B12: Vitamin B-12: >1504 pg/mL — ABNORMAL HIGH (ref 211–911)

## 2021-11-13 LAB — TSH: TSH: 1.64 u[IU]/mL (ref 0.35–5.50)

## 2021-11-13 MED ORDER — LEVOTHYROXINE SODIUM 88 MCG PO TABS: 88.0000 ug | ORAL_TABLET | Freq: Every day | ORAL | 3 refills | Status: DC

## 2021-11-13 NOTE — Progress Notes (Signed)
? ? ?Name: Natasha Roberts  ?MRN/ DOB: [id number], [birth year]    ?Age/ Sex: 61 y.o., female   ? ?PCP: Ladell Pier, MD   ?Reason for Endocrinology Evaluation: Hypothyroidism  ?   ?Date of Initial Endocrinology Evaluation: 11/13/2021   ? ? ?HPI: ?Natasha Roberts is a 61 y.o. female with a past medical history of hypothyroid and dyslipidemia. The patient presented for initial endocrinology clinic visit on 11/13/2021 for consultative assistance with her Hypothyroidism.  ? ?Interpretor line was used today  ? ? ?She has been diagnosed with hyperthyroidism , she is S/P RAI ablation   16 years ago. She was started on Levothyroxine immediately after radiation  ? ? ?She was recently noted to have low TSH and levothyroxine decreased from 100 to 88 mcg , Two months prior to that her TSH was elevated at 26.7 uIU/mL due to lack of insurance . ? ? ? ?She takes it appropriately in recent months and since having insurance  ? ?She has not noted any difference since reducing LT-4 dose  ? ?Denies weight loss  ?She has fatigue  ?Denies diarrhea or constipation  ?Has palpitations for a year now  ?She has fatigue  ? ? ?No prior dx of cardiac arhythmia or osteoporosis  ? ?No Fh of thyroid disease  ? ? ?Levothyroxine 88 mcg daily  ? ? ?HISTORY:  ?Past Medical History:  ?Past Medical History:  ?Diagnosis Date  ? Thyroid disease hypothyroidism  ? ?Past Surgical History:  ?Past Surgical History:  ?Procedure Laterality Date  ? ABDOMINAL HYSTERECTOMY    ?  ?Social History:  reports that she has been smoking. She has never used smokeless tobacco. She reports that she does not drink alcohol and does not use drugs. ?Family History: family history is not on file. ? ? ?HOME MEDICATIONS: ?Allergies as of 11/13/2021   ?No Known Allergies ?  ? ?  ?Medication List  ?  ? ?  ? Accurate as of November 13, 2021  8:44 AM. If you have any questions, ask your nurse or doctor.  ?  ?  ? ?  ? ?STOP taking these medications   ? ?B-complex with vitamin C  tablet ?Stopped by: Dorita Sciara, MD ?  ?glucosamine-chondroitin 500-400 MG tablet ?Stopped by: Dorita Sciara, MD ?  ?ondansetron 8 MG disintegrating tablet ?Commonly known as: Zofran ODT ?Stopped by: Dorita Sciara, MD ?  ?pantoprazole 40 MG tablet ?Commonly known as: PROTONIX ?Stopped by: Dorita Sciara, MD ?  ?vitamin C 1000 MG tablet ?Stopped by: Dorita Sciara, MD ?  ? ?  ? ?TAKE these medications   ? ?acetaminophen 325 MG tablet ?Commonly known as: TYLENOL ?Take 2 tablets (650 mg total) by mouth every 6 (six) hours as needed for mild pain (or Fever >/= 101). ?  ?atorvastatin 10 MG tablet ?Commonly known as: LIPITOR ?Take 1 tablet (10 mg total) by mouth daily. ?  ?CVS B12 GUMMIES PO ?Take by mouth. ?  ?hydrocortisone cream 1 % ?Apply 1 application. topically. ?  ?levothyroxine 88 MCG tablet ?Commonly known as: SYNTHROID ?Take 1 tablet (88 mcg total) by mouth daily before breakfast. ?  ?loperamide 2 MG tablet ?Commonly known as: Imodium A-D ?Take 1 tablet (2 mg total) by mouth 4 (four) times daily as needed for diarrhea or loose stools. ?  ?multivitamin tablet ?Take 1 tablet by mouth daily. ?  ? ?  ?  ? ? ?REVIEW OF SYSTEMS: ?A comprehensive ROS was conducted with the  patient and is negative except as per HPI  ? ? ? ?OBJECTIVE:  ?VS: BP (!) 132/98 (BP Location: Right Arm, Patient Position: Sitting, Cuff Size: Normal)   Pulse 96   Ht '5\' 2"'  (1.575 m)   Wt 138 lb 12.8 oz (63 kg)   SpO2 97%   BMI 25.39 kg/m?   ? ?Wt Readings from Last 3 Encounters:  ?11/13/21 138 lb 12.8 oz (63 kg)  ?05/01/21 140 lb (63.5 kg)  ?02/25/21 135 lb (61.2 kg)  ? ? ? ?EXAM: ?General: Pt appears well and is in NAD  ?Eyes: External eye exam normal with a stare, lid lag or exophthalmos.  EOM intact.  PERRL.  ?Neck: General: Supple without adenopathy. ?Thyroid: Thyroid size normal.  No goiter or nodules appreciated.  ?Lungs: Clear with good BS bilat with no rales, rhonchi, or wheezes  ?Heart: Auscultation:  RRR.  ?Abdomen: Normoactive bowel sounds, soft, nontender, without masses or organomegaly palpable  ?Extremities:  ?BL LE: No pretibial edema normal ROM and strength.  ?Mental Status: Judgment, insight: Intact ?Orientation: Oriented to time, place, and person ?Mood and affect: No depression, anxiety, or agitation  ? ? ? ?DATA REVIEWED: ? ?  ? ? Latest Reference Range & Units 08/25/21 16:18  ?Sodium 134 - 144 mmol/L 142  ?Potassium 3.5 - 5.2 mmol/L 4.3  ?Chloride 96 - 106 mmol/L 104  ?CO2 20 - 29 mmol/L 25  ?Glucose 70 - 99 mg/dL 88  ?BUN 6 - 24 mg/dL 11  ?Creatinine 0.57 - 1.00 mg/dL 0.74  ?Calcium 8.7 - 10.2 mg/dL 9.6  ?BUN/Creatinine Ratio 9 - 23  15  ?eGFR >59 mL/min/1.73 93  ?Alkaline Phosphatase 44 - 121 IU/L 71  ?Albumin 3.8 - 4.9 g/dL 4.4  ?Albumin/Globulin Ratio 1.2 - 2.2  1.7  ?AST 0 - 40 IU/L 19  ?ALT 0 - 32 IU/L 17  ?Total Protein 6.0 - 8.5 g/dL 7.0  ?Total Bilirubin 0.0 - 1.2 mg/dL 0.3  ? ? Latest Reference Range & Units 05/01/21 14:59 08/25/21 16:18 10/13/21 16:15  ?TSH 0.450 - 4.500 uIU/mL 2.080 26.700 (H) 0.164 (L)  ?Triiodothyronine,Free,Serum 2.0 - 4.4 pg/mL 2.9  3.0  ?T4,Free(Direct) 0.82 - 1.77 ng/dL 1.37  1.59  ? ? ? ?ASSESSMENT/PLAN/RECOMMENDATIONS:  ? ?Post ablative hypothyroid ? ?-She is status post RAI years ago for hyperthyroid.  ?- Pt is clinically euthyroid  ?- No local neck symptoms  ?- Pt educated extensively on the correct way to take levothyroxine (first thing in the morning with water, 30 minutes before eating or taking other medications). ?- Pt encouraged to double dose the following day if she were to miss a dose given long half-life of levothyroxine. ?-We also discussed the risk of myxedema, with and controlled hypothyroidism, as well as cardiovascular and increased bone resorption with suprabasal LT-for replacement ?-Emphasized importance of taking levothyroxine consistently to keep her TSH within the normal range instead of the variable fluctuation ? ?-TFTs today are  normal ? ?Medications : ?Continue levothyroxine 88 mcg daily  ? ? ?2. Fatigue: ? ?-Vitamin D is low, patient will be advised to start OTC vitamin D3 1000 IU daily ?-Vitamin B 12 is acceptable ? ? ? ? ?Follow-up in 4 months ? ? ?Signed electronically by: ?Abby Nena Jordan, MD ? ?[facility]  ?[facility] ?[address] ?[place], [place] [address] ?Phone: [phone] ?FAX: [phone] ? ? ?CC: ?Ladell Pier, MD ?[address] ?[place] [address] ?Phone: [phone] ?Fax: [phone] ? ? ?  Return to Endocrinology clinic as below: ?Future Appointments  ?Date Time Provider [place]  ?11/13/2021  8:50 AM Sirron Francesconi, Melanie Crazier, MD [facility] None  ?  ? ? ? ? ? ?

## 2021-11-13 NOTE — Patient Instructions (Addendum)
You are on levothyroxine - which is your thyroid hormone supplement. You MUST take this consistently. ? ?You should take this first thing in the morning on an empty stomach with water. You should not take it with other medications. Wait 12min to 1hr prior to eating. If you are taking any vitamins - please take these in the evening.  ? ?If you miss a dose, please take your missed dose the following day (double the dose for that day). ?You should have a pill box for ONLY levothyroxine on your bedside table to help you remember to take your medications. ? ? ? ?Est? tomando levotiroxina, que es su suplemento de hormona tiroidea. DEBE tomar esto consistentemente. ? ?Debe tomar esto a primera hora de la ma?ana con el est?mago vac?o con agua. No debe tomarlo con otros medicamentos. Espere de 30 minutos a 1 hora antes de comer. Si est? tomando vitaminas, t?melas por la noche. ? ?Si olvida una dosis, tome la dosis olvidada al d?a siguiente (el doble de la dosis de ese d?a). ?Debe tener un pastillero S?LO para levotiroxina en su mesita de noche para recordar tomar sus medicamentos. ?

## 2022-03-09 ENCOUNTER — Encounter: Payer: Self-pay | Admitting: Internal Medicine

## 2022-03-09 ENCOUNTER — Ambulatory Visit (INDEPENDENT_AMBULATORY_CARE_PROVIDER_SITE_OTHER): Payer: 59 | Admitting: Internal Medicine

## 2022-03-09 VITALS — BP 118/74 | HR 94 | Ht 62.0 in | Wt 137.6 lb

## 2022-03-09 DIAGNOSIS — E890 Postprocedural hypothyroidism: Secondary | ICD-10-CM | POA: Diagnosis not present

## 2022-03-09 DIAGNOSIS — E039 Hypothyroidism, unspecified: Secondary | ICD-10-CM | POA: Diagnosis not present

## 2022-03-09 LAB — TSH: TSH: 0.86 u[IU]/mL (ref 0.35–5.50)

## 2022-03-09 LAB — T4, FREE: Free T4: 0.98 ng/dL (ref 0.60–1.60)

## 2022-03-09 NOTE — Progress Notes (Unsigned)
Name: Natasha Roberts  MRN/ DOB: [id number], 27-Apr-1961    Age/ Sex: 61 y.o., female    PCP: Ladell Pier, MD   Reason for Endocrinology Evaluation: Hypothyroidism     Date of Initial Endocrinology Evaluation: 11/13/2021    HPI: Natasha Roberts is a 61 y.o. female with a past medical history of hypothyroid and dyslipidemia. The patient presented for initial endocrinology clinic visit on 11/13/2021 for consultative assistance with her Hypothyroidism.      She has been diagnosed with hyperthyroidism , she is S/P RAI ablation   16 years ago. She was started on Levothyroxine immediately after radiation    In 08/2021 was noted to have low TSH and levothyroxine decreased from 100 to 88 mcg , Two months prior to that her TSH was elevated at 26.7 uIU/mL due to lack of insurance .   No prior dx of cardiac arhythmia or osteoporosis   No Fh of thyroid disease     SUBJECTIVE:    Today (03/09/22):  Natasha Roberts is here for a follow up on hypothyroidism  She takes it appropriately in recent months and since having insurance   Denies weight loss  No local neck symptoms  Has occasional  diarrhea but no constipation  palpitations are improving  Has cold and heat intolerance  She continues with fatigue, no motivation  Denies depression    Levothyroxine 88 mcg daily    HISTORY:  Past Medical History:  Past Medical History:  Diagnosis Date   Thyroid disease hypothyroidism   Past Surgical History:  Past Surgical History:  Procedure Laterality Date   ABDOMINAL HYSTERECTOMY      Social History:  reports that she has been smoking. She has never used smokeless tobacco. She reports that she does not drink alcohol and does not use drugs. Family History: family history is not on file.   HOME MEDICATIONS: Allergies as of 03/09/2022   No Known Allergies      Medication List        Accurate as of March 09, 2022  1:41 PM. If you have any questions, ask your nurse or  doctor.          acetaminophen 325 MG tablet Commonly known as: TYLENOL Take 2 tablets (650 mg total) by mouth every 6 (six) hours as needed for mild pain (or Fever >/= 101).   atorvastatin 10 MG tablet Commonly known as: LIPITOR Take 1 tablet (10 mg total) by mouth daily.   CVS B12 GUMMIES PO Take by mouth.   hydrocortisone cream 1 % Apply 1 application. topically.   levothyroxine 88 MCG tablet Commonly known as: SYNTHROID Take 1 tablet (88 mcg total) by mouth daily before breakfast.   loperamide 2 MG tablet Commonly known as: Imodium A-D Take 1 tablet (2 mg total) by mouth 4 (four) times daily as needed for diarrhea or loose stools.   multivitamin tablet Take 1 tablet by mouth daily.          REVIEW OF SYSTEMS: A comprehensive ROS was conducted with the patient and is negative except as per HPI     OBJECTIVE:  VS: BP 118/74 (BP Location: Left Arm, Patient Position: Sitting, Cuff Size: Small)   Pulse 94   Ht '5\' 2"'  (1.575 m)   Wt 137 lb 9.6 oz (62.4 kg)   SpO2 97%   BMI 25.17 kg/m    Wt Readings from Last 3 Encounters:  03/09/22 137 lb 9.6 oz (62.4 kg)  11/13/21 138  lb 12.8 oz (63 kg)  05/01/21 140 lb (63.5 kg)     EXAM: General: Pt appears well and is in NAD  Eyes: External eye exam normal with a stare, lid lag or exophthalmos.  EOM intact.  PERRL.  Neck: General: Supple without adenopathy. Thyroid: Thyroid size normal.  No goiter or nodules appreciated.  Lungs: Clear with good BS bilat with no rales, rhonchi, or wheezes  Heart: Auscultation: RRR.  Abdomen: Normoactive bowel sounds, soft, nontender, without masses or organomegaly palpable  Extremities:  BL LE: No pretibial edema normal ROM and strength.  Mental Status: Judgment, insight: Intact Orientation: Oriented to time, place, and person Mood and affect: No depression, anxiety, or agitation     DATA REVIEWED:      Latest Reference Range & Units 08/25/21 16:18  Sodium 134 - 144 mmol/L  142  Potassium 3.5 - 5.2 mmol/L 4.3  Chloride 96 - 106 mmol/L 104  CO2 20 - 29 mmol/L 25  Glucose 70 - 99 mg/dL 88  BUN 6 - 24 mg/dL 11  Creatinine 0.57 - 1.00 mg/dL 0.74  Calcium 8.7 - 10.2 mg/dL 9.6  BUN/Creatinine Ratio 9 - 23  15  eGFR >59 mL/min/1.73 93  Alkaline Phosphatase 44 - 121 IU/L 71  Albumin 3.8 - 4.9 g/dL 4.4  Albumin/Globulin Ratio 1.2 - 2.2  1.7  AST 0 - 40 IU/L 19  ALT 0 - 32 IU/L 17  Total Protein 6.0 - 8.5 g/dL 7.0  Total Bilirubin 0.0 - 1.2 mg/dL 0.3    Latest Reference Range & Units 05/01/21 14:59 08/25/21 16:18 10/13/21 16:15  TSH 0.450 - 4.500 uIU/mL 2.080 26.700 (H) 0.164 (L)  Triiodothyronine,Free,Serum 2.0 - 4.4 pg/mL 2.9  3.0  T4,Free(Direct) 0.82 - 1.77 ng/dL 1.37  1.59     ASSESSMENT/PLAN/RECOMMENDATIONS:   Post ablative hypothyroid  -She is status post RAI years ago for hyperthyroid.  - Pt is clinically euthyroid  - No local neck symptoms  - Pt educated extensively on the correct way to take levothyroxine (first thing in the morning with water, 30 minutes before eating or taking other medications). - Pt encouraged to double dose the following day if she were to miss a dose given long half-life of levothyroxine. -We also discussed the risk of myxedema, with and controlled hypothyroidism, as well as cardiovascular and increased bone resorption with suprabasal LT-for replacement -Emphasized importance of taking levothyroxine consistently to keep her TSH within the normal range instead of the variable fluctuation  -TFTs today are normal  Medications : Continue levothyroxine 88 mcg daily       Follow-up in 4 months   Signed electronically by: Mack Guise, MD  [facility]  [address] [place], [place] [address] Phone: [phone] FAX: [phone]   CC: Ladell Pier, MD [place] [address] Phone: [phone] Fax:  [phone]   Return to Endocrinology clinic as below: Future Appointments  Date Time Provider [place]  03/09/2022  2:20 PM Mischa Pollard, Melanie Crazier, MD [facility] None

## 2022-03-10 MED ORDER — LEVOTHYROXINE SODIUM 88 MCG PO TABS: 88.0000 ug | ORAL_TABLET | Freq: Every day | ORAL | 3 refills | Status: DC

## 2022-04-05 ENCOUNTER — Other Ambulatory Visit: Payer: Self-pay | Admitting: Physician Assistant

## 2022-04-05 DIAGNOSIS — E785 Hyperlipidemia, unspecified: Secondary | ICD-10-CM

## 2022-04-20 ENCOUNTER — Encounter: Payer: Self-pay | Admitting: Physician Assistant

## 2022-04-20 ENCOUNTER — Ambulatory Visit: Payer: Self-pay

## 2022-04-20 ENCOUNTER — Ambulatory Visit: Payer: Commercial Managed Care - HMO | Admitting: Physician Assistant

## 2022-04-20 VITALS — BP 133/86 | HR 84 | Resp 19 | Wt 140.0 lb

## 2022-04-20 DIAGNOSIS — R911 Solitary pulmonary nodule: Secondary | ICD-10-CM

## 2022-04-20 DIAGNOSIS — M546 Pain in thoracic spine: Secondary | ICD-10-CM | POA: Diagnosis not present

## 2022-04-20 DIAGNOSIS — F1010 Alcohol abuse, uncomplicated: Secondary | ICD-10-CM | POA: Diagnosis not present

## 2022-04-20 DIAGNOSIS — Z720 Tobacco use: Secondary | ICD-10-CM

## 2022-04-20 DIAGNOSIS — F17210 Nicotine dependence, cigarettes, uncomplicated: Secondary | ICD-10-CM | POA: Diagnosis not present

## 2022-04-20 NOTE — Telephone Encounter (Addendum)
  Chief Complaint: mid back pain Symptoms: pain, fever, severe pain Frequency: 1 month Pertinent Negatives: Patient denies urination pain or sx, weakness, numbness or problems with bladder control Disposition: [] ED /[] Urgent Care (no appt availability in office) / [] Appointment(In office/virtual)/ []  [facility]/ [] Home Care/ [x] Refused Recommended Disposition /[] [facility]/ []  Follow-up with PCP Additional Notes: address of bus given- no available appt's - advised can go to [facility] as well. Pt dissatisfied because of no appt's with provider. Spanish interpreter [phone] [facility] Reason for Disposition  [1] SEVERE back pain (e.g., excruciating, unable to do any normal activities) AND [2] not improved 2 hours after pain medicine  Answer Assessment - Initial Assessment Questions 1. ONSET: "When did the pain begin?"      1 month 2. LOCATION: "Where does it hurt?" (upper, mid or lower back)     mid 3. SEVERITY: "How bad is the pain?"  (e.g., Scale 1-10; mild, moderate, or severe)   - MILD (1-3): Doesn't interfere with normal activities.    - MODERATE (4-7): Interferes with normal activities or awakens from sleep.    - SEVERE (8-10): Excruciating pain, unable to do any normal activities.      severe 4. PATTERN: "Is the pain constant?" (e.g., yes, no; constant, intermittent)      Comes and goes 5. RADIATION: "Does the pain shoot into your legs or somewhere else?"     no 6. CAUSE:  "What do you think is causing the back pain?"      kidneys 7. BACK OVERUSE:  "Any recent lifting of heavy objects, strenuous work or exercise?"     no 8. MEDICINES: "What have you taken so far for the pain?" (e.g., nothing, acetaminophen, NSAIDS)     Motrin, Excedrin 9. NEUROLOGIC SYMPTOMS: "Do you have any weakness, numbness, or problems with bowel/bladder control?"     no 10. OTHER SYMPTOMS: "Do you have any other symptoms?" (e.g., fever, abdomen pain, burning with urination, blood in urine)        Fever- subjective, feeling 11. PREGNANCY: "Is there any chance you are pregnant?" "When was your last menstrual period?"       N/a  Protocols used: Back Pain-A-AH

## 2022-04-20 NOTE — Telephone Encounter (Signed)
Called pt using Spanish interpreter [place]o [phone] LM on VM to call back incase would like a [facility] video visit. Call back number provided.

## 2022-04-20 NOTE — Progress Notes (Unsigned)
Established Patient Office Visit  Subjective   Patient ID: Natasha Roberts, female    DOB: Feb 11, 1961  Age: 61 y.o. MRN: [id number]  Chief Complaint  Patient presents with   Back Pain    States having intermittent pain in her right upper back , just below her shoulder blade approx 5 months.  Denies radiation, injury or trauma.  States that the pain seems to be worse after she drinks alcohol, states that she does drink 6-7 alcoholic beverages each day on Friday Saturday and Sunday.  States that she will use Excedrin or Advil with some relief.  Also endorses that she will have diarrhea episodes every other day, states that this has been going on for "a long time" states that there has not been any change to this since she started having this pain.  States that she does not like the taste of water, drinks approximately 1 cup a day.  States that she does drink approximately 2 cups of coffee.  States that she works but denies having to do any heavy lifting.  States that her sleep is poor, difficulty falling asleep and staying asleep, states that the only thing that works for her is lorazepam which she [place] from [place], states that she will take this if she has not slept well for 3 nights in a row, states that offers relief.  States that she is an everyday smoker, states that she smokes approximately 3 cigarettes a day, states that she has been smoking for 30 years.  Did have abnormal CT of lungs:  IMPRESSION: No acute findings.   Colonic diverticulosis, without radiographic evidence of diverticulitis.   Tiny right middle lobe pulmonary nodules, largest measuring 3 mm. No follow-up needed if patient is low-risk (and has no known or suspected primary neoplasm). Non-contrast chest CT can be considered in 12 months if patient is high-risk. This recommendation follows the consensus statement: Guidelines for Management of Incidental Pulmonary Nodules Detected on CT  Images: From the Fleischner Society 2017; Radiology 2017; 284:228-243.     Electronically Signed   By: Danae Orleans M.D.   On: 12/31/2020 20:21  Due to language barrier, an interpreter was present during the history-taking and subsequent discussion (and for part of the physical exam) with this patient. Minus Liberty #[id number]    Past Medical History:  Diagnosis Date   Thyroid disease hypothyroidism   Social History   Socioeconomic History   Marital status: Single    Spouse name: Not on file   Number of children: Not on file   Years of education: Not on file   Highest education level: Not on file  Occupational History   Not on file  Tobacco Use   Smoking status: Every Day    Packs/day: 0.10    Types: Cigarettes    Start date: 04/16/1992   Smokeless tobacco: Never  Substance and Sexual Activity   Alcohol use: Yes    Alcohol/week: 25.0 standard drinks of alcohol    Types: 25 Standard drinks or equivalent per week   Drug use: Never   Sexual activity: Not on file  Other Topics Concern   Not on file  Social History Narrative   Not on file   Social Determinants of Health   Financial Resource Strain: Not on file  Food Insecurity: Not on file  Transportation Needs: Not on file  Physical Activity: Not on file  Stress: Not on file  Social Connections: Not on file  Intimate Partner Violence:  Not on file   History reviewed. No pertinent family history. No Known Allergies  Review of Systems  Constitutional: Negative.   HENT: Negative.    Eyes: Negative.   Respiratory:  Negative for shortness of breath.   Cardiovascular:  Negative for chest pain.  Gastrointestinal:  Positive for diarrhea. Negative for abdominal pain, constipation, heartburn, nausea and vomiting.  Genitourinary:  Negative for dysuria, frequency, hematuria and urgency.  Musculoskeletal:  Positive for back pain and myalgias.  Skin: Negative.   Neurological: Negative.   Endo/Heme/Allergies: Negative.    Psychiatric/Behavioral:  The patient has insomnia.       Objective:     BP 133/86 (BP Location: Right Arm)   Pulse 84   Resp 19   Wt 140 lb (63.5 kg)   BMI 25.61 kg/m  BP Readings from Last 3 Encounters:  04/20/22 133/86  03/09/22 118/74  11/13/21 (!) 132/98   Wt Readings from Last 3 Encounters:  04/20/22 140 lb (63.5 kg)  03/09/22 137 lb 9.6 oz (62.4 kg)  11/13/21 138 lb 12.8 oz (63 kg)      Physical Exam Vitals reviewed.  Constitutional:      Appearance: Normal appearance.  HENT:     Head: Normocephalic and atraumatic.     Right Ear: External ear normal.     Left Ear: External ear normal.     Nose: Nose normal.     Mouth/Throat:     Mouth: Mucous membranes are moist.     Pharynx: Oropharynx is clear.  Eyes:     Extraocular Movements: Extraocular movements intact.     Conjunctiva/sclera: Conjunctivae normal.     Pupils: Pupils are equal, round, and reactive to light.  Cardiovascular:     Rate and Rhythm: Normal rate and regular rhythm.     Pulses: Normal pulses.     Heart sounds: Normal heart sounds.  Pulmonary:     Effort: Pulmonary effort is normal.     Breath sounds: Normal breath sounds.  Abdominal:     Comments: Exam completed on screening van, unable to perform full abdominal exam; patient understands and agrees   Musculoskeletal:        General: Normal range of motion.     Cervical back: Normal range of motion and neck supple.  Skin:    General: Skin is warm and dry.  Neurological:     General: No focal deficit present.     Mental Status: She is alert and oriented to person, place, and time.  Psychiatric:        Mood and Affect: Mood normal.        Behavior: Behavior normal.        Thought Content: Thought content normal.        Judgment: Judgment normal.        Assessment & Plan:   Problem List Items Addressed This Visit       Other   Incidental lung nodule, less than or equal to 77mm   Relevant Orders   CT Chest Wo Contrast    Tobacco abuse   Relevant Orders   CT Chest Wo Contrast   Alcohol abuse   Other Visit Diagnoses     Acute right-sided thoracic back pain    -  Primary     1. Acute right-sided thoracic back pain Patient encouraged to increase hydration, gentle stretching, other supportive care measures given and patient education.  Red flags given for prompt reevaluation.  2. Incidental lung nodule, less than  or equal to 55mm  - CT Chest Wo Contrast; Future  3. Tobacco abuse Patient strongly encouraged to begin smoking cessation, not ready to quit at this time - CT Chest Wo Contrast; Future  4. Alcohol abuse Patient strongly encouraged to reduce alcohol consumption.   I have reviewed the patient's medical history (PMH, PSH, Social History, Family History, Medications, and allergies) , and have been updated if relevant. I spent 30 minutes reviewing chart and  face to face time with patient.     Return if symptoms worsen or fail to improve.    Loraine Grip Mayers, PA-C

## 2022-04-21 ENCOUNTER — Telehealth: Payer: Self-pay | Admitting: *Deleted

## 2022-04-21 DIAGNOSIS — Z720 Tobacco use: Secondary | ICD-10-CM | POA: Insufficient documentation

## 2022-04-21 DIAGNOSIS — R911 Solitary pulmonary nodule: Secondary | ICD-10-CM | POA: Insufficient documentation

## 2022-04-21 DIAGNOSIS — F1010 Alcohol abuse, uncomplicated: Secondary | ICD-10-CM | POA: Insufficient documentation

## 2022-04-21 NOTE — Telephone Encounter (Signed)
Pt has been seen on mobile unit. 

## 2022-04-21 NOTE — Patient Instructions (Signed)
I encourage you to increase your water intake, you should be drinking at least 64 ounces of water a day.  I encourage you to work on smoking cessation and dramatically reduce the amount of your alcohol consumption.  We will call you with the appointment for the CAT scan of your lungs.  To help with your back pain, I also encourage you to start working on gentle stretching.  Roney Jaffe, PA-C Physician Assistant [facility] [email or web address]   Dolor de espalda agudo en los adultos Acute Back Pain, Adult El dolor de espalda agudo es repentino y por lo general no dura mucho tiempo. Se debe generalmente a una lesin de los msculos y tejidos de la espalda. La lesin puede ser el resultado de: Estiramiento en exceso o desgarro de un msculo, tendn o ligamento. Los ligamentos son tejidos que Owens [employer] Minor. Levantar algo de forma incorrecta puede producir un esguince de espalda. Desgaste (degeneracin) de los discos vertebrales. Los discos vertebrales son tejidos circulares que proporcionan amortiguacin entre los huesos de la columna vertebral (vrtebras). Movimientos de giro, como al practicar deportes o realizar trabajos de [place]. Un golpe en la espalda. Artritis. Es posible Producer, television/film/video un examen fsico, anlisis de laboratorio u otros estudios de diagnstico por imgenes para Veterinary surgeon causa del Engineer, mining. El dolor de espalda agudo generalmente desaparece con reposo y cuidados en la casa. Siga estas instrucciones en su casa: Control del dolor, la rigidez y Architect los medicamentos de venta libre y los recetados solamente como se lo haya indicado el mdico. El tratamiento puede incluir medicamentos para Chief Technology Officer y la inflamacin que se toman por la boca o que se aplican sobre la piel, o relajantes musculares. El mdico puede recomendarle que se aplique hielo durante las primeras 24 a 48 horas despus del comienzo  del Engineer, mining. Para hacer esto: Ponga el hielo en una bolsa plstica. Coloque una toalla entre la piel y Copy. Aplique el hielo durante 20 minutos, 2 o 3 veces por da. Retire el hielo si la piel se pone de color rojo brillante. Esto es Intel. Si no puede sentir dolor, calor o fro, tiene un mayor riesgo de que se dae la zona. Si se lo indican, aplique calor en la zona afectada con la frecuencia que le haya indicado el mdico. Use la fuente de calor que el mdico le recomiende, como una compresa de calor hmedo o una almohadilla trmica. Coloque una toalla entre la piel y la fuente de Airline pilot. Aplique calor durante 20 a 30 minutos. Retire la fuente de calor si la piel se pone de color rojo brillante. Esto es especialmente importante si no puede sentir dolor, calor o fro. Corre un mayor riesgo de sufrir quemaduras. Actividad  No permanezca en la cama. Hacer reposo en la cama por ms de 1 a 2 das puede demorar su recuperacin. Mantenga una buena postura al sentarse y pararse. No se incline hacia adelante al sentarse ni se encorve al pararse. Si trabaja en un escritorio, sintese cerca de este para no tener que inclinarse. Mantenga el mentn hacia abajo. Mantenga el cuello hacia atrs y los codos flexionados en un ngulo de 90 grados (ngulo recto). Cuando conduzca, sintese elevado y cerca del volante. Agregue un apoyo para la espalda (lumbar) al asiento del automvil, si es necesario. Realice caminatas cortas en superficies planas tan pronto como le sea posible. Trate de caminar un poco ms de Pharmacist, community. No se siente,  conduzca o permanezca de pie en un mismo lugar durante ms de 30 minutos seguidos. Pararse o sentarse durante largos perodos de Contractor la espalda. No conduzca ni use maquinaria pesada mientras toma analgsicos recetados. Use tcnicas apropiadas para levantar objetos. Cuando se inclina y Solicitor un [place], utilice posiciones que no sobrecarguen tanto  la espalda: Flexione las rodillas. Mantenga la carga cerca del cuerpo. No se tuerza. Haga actividad fsica habitualmente como se lo haya indicado el mdico. Hacer ejercicios ayuda a que la espalda sane ms rpido y [place] a Automotive engineer las lesiones de la espalda al [employer] msculos fuertes y flexibles. Trabaje con un fisioterapeuta para crear un programa de ejercicios seguros, segn lo recomiende el mdico. Haga ejercicios como se lo haya indicado el fisioterapeuta. Estilo de vida Mantenga un peso saludable. El sobrepeso sobrecarga la espalda y hace que resulte difcil tener una buena [place]. Evite actividades o situaciones que lo hagan sentirse ansioso o estresado. El estrs y la ansiedad aumentan la tensin muscular y pueden empeorar el dolor de espalda. Aprenda formas de [employer]raw[employer]Hill ansiedad y [place], como a travs del ejercicio. Instrucciones generales [place] sobre un colchn firme en una posicin cmoda. Intente acostarse de costado, con las rodillas ligeramente flexionadas. Si se recuesta Fisher Scientific, coloque una almohada debajo de las rodillas. Mantenga la cabeza y el cuello en lnea recta con la columna vertebral (posicin neutra) cuando use equipos electrnicos como telfonos inteligentes o tablets. Para hacer esto: Levante el telfono inteligente o la tablet para Education officer, community de inclinar la cabeza o el cuello para mirar hacia abajo. Coloque el telfono inteligente o la tablet al nivel de su cara mientras mira la pantalla. Siga el plan de tratamiento como se lo haya indicado el mdico. Esto puede incluir: Terapia cognitiva o conductual. Acupuntura o terapia de masajes. Yoga o meditacin. Comunquese con un mdico si: Siente un dolor que no se alivia con reposo o medicamentos. Siente mucho dolor que se extiende a las piernas o las nalgas. El dolor no mejora luego de 2 semanas. Siente dolor por la noche. Pierde peso sin proponrselo. Tiene fiebre o escalofros. Siente  nuseas o vmitos. Siente dolor abdominal. Solicite ayuda de inmediato si: Tiene nuevos problemas para controlar la vejiga o los intestinos. Siente debilidad o adormecimiento inusuales en los brazos o en las piernas. Siente que va a desmayarse. Estos sntomas pueden representar un problema grave que constituye Radio broadcast assistant. No espere a ver si los sntomas desaparecen. Solicite atencin mdica de inmediato. Comunquese con el servicio de emergencias de su localidad (911 en los [place]). No conduzca por sus propios medios OfficeMax Incorporated. Resumen El dolor de espalda agudo es repentino y por lo general no dura mucho tiempo. Use tcnicas apropiadas para levantar objetos. Cuando se inclina y levanta un [place], utilice posiciones que no sobrecarguen tanto la espalda. Tome los medicamentos de venta libre y los recetados solamente como se lo haya indicado el mdico, y aplquese calor o hielo segn las indicaciones. Esta informacin no tiene Theme park manager el consejo del mdico. Asegrese de hacerle al mdico cualquier pregunta que tenga. Document Revised: 11/19/2020 Document Reviewed: 11/19/2020 Elsevier Patient Education  2023 Elsevier Inc.  Trastorno por consumo de bebidas alcohlicas Alcohol Use Disorder El trastorno por consumo de bebidas alcohlicas es una afeccin en la que el consumo de alcohol altera la vida cotidiana. Las personas con esta afeccin consumen bebidas alcohlicas en exceso y no pueden controlar su consumo. El trastorno  por consumo de bebidas alcohlicas puede causar problemas graves en la salud fsica. Puede afectar al cerebro, al corazn y a otros rganos internos. Este trastorno puede aumentar el riesgo de Insurance risk surveyor ciertos tipos de cncer y causar problemas de salud mental, como depresin o ansiedad. Cules son las causas? Esta afeccin se debe al consumo excesivo de alcohol con el transcurso del [place]. Algunas personas que sufren esta afeccin beben para  enfrentarse a situaciones negativas de la vida o escapar de ellas. Otros beben para E. [employer]. [employer] de dolor fsico o los sntomas de una enfermedad mental. Qu incrementa el riesgo? Es ms probable que sufra esta afeccin si: Tiene antecedentes familiares de trastorno por consumo de bebidas alcohlicas. Su cultura fomenta el consumo de bebidas alcohlicas hasta el punto de emborracharse (intoxicacin alcohlica). Tuvo un trastorno emocional o de conducta en la infancia. Ha sufrido abusos. Es adolescente y: Tiene un desempeo deficiente en la escuela. Recibe poca supervisin o gua. Acta de forma impulsiva y le gusta tomar riesgos. Cules son los signos o sntomas? Los sntomas de esta afeccin incluyen: Beber ms de lo que desea. Intentar beber Kellogg, sin xito. Pasar mucho tiempo pensando en el alcohol, intentando conseguir alcohol, bebiendo alcohol o recuperndose de haber bebido alcohol. Continuar bebiendo incluso cuando UGI Corporation causa problemas graves en su vida cotidiana. Beber cuando es peligroso hacerlo, por ejemplo, antes de conducir. Necesitar cada vez ms alcohol para obtener el mismo efecto que busca (generar tolerancia). Tener sntomas de abstinencia al dejar de beber. Entre los sntomas de abstinencia se incluyen los siguientes: Dificultad para dormir, que produce cansancio (fatiga). Cambios en el estado de nimo, con depresin y [place]. Sntomas fsicos, como frecuencia cardaca acelerada, respiracin rpida, presin arterial elevada (hipertensin), fiebre, sudoracin fra o nuseas. Convulsiones. Confusin grave. Ver o sentir cosas que no existen (alucinaciones). Temblores que no se Engineer, petroleum. Cmo se diagnostica? Esta afeccin se diagnostica mediante una evaluacin. Para comenzar, el mdico puede hacerle tres o cuatro preguntas sobre su consumo de alcohol o entregarle una prueba sencilla que deber Optometrist. Esto ayudar a obtener  informacin clara sobre usted. Pueden hacerle un examen fsico o anlisis. Pueden derivarlo a un terapeuta especializado en abuso de sustancias. Cmo se trata? Con la educacin, algunas personas con trastorno por consumo de bebidas alcohlicas pueden reducir su consumo. Muchas personas con este trastorno no son capaces de modificar su comportamiento por su cuenta y necesitan la ayuda de un tratamiento por parte de especialistas en abuso de sustancias. Los tratamientos pueden incluir lo siguiente: Desintoxicacin. La desintoxicacin consiste en dejar de beber, con la supervisin y las instrucciones de los mdicos. El mdico puede recetarle medicamentos en la primera semana para ayudar a disminuir los sntomas de la abstinencia. La abstinencia puede ser peligrosa y potencialmente mortal. La desintoxicacin puede realizarse en Engineer, mining, en un mbito extrahospitalario, en un centro de atencin primaria, en un hospital o en un centro de tratamiento para abuso de sustancias. Asesoramiento psicolgico. Puede incluir entrevistas motivacionales (EM), terapia familiar o terapia cognitivo-conductual (TCC). Un terapeuta puede abordar cmo cambiar su comportamiento de consumo de bebidas alcohlicas y cmo International Paper. La terapia tiene como objetivos: Identificar sus motivaciones positivas para Quarry manager. Identificar y evitar aquello que lo conduzca a beber alcohol. Ensearle a planificar su cambio de comportamiento. Proponer sistemas de apoyo que puedan ayudarlo a Building control surveyor. Medicamentos. Los medicamentos pueden ayudar a tratar este trastorno de la siguiente manera: Disminuyen el deseo intenso de consumir. Reducen  el sentimiento positivo que experimenta al beber alcohol. Causan una reaccin fsica desagradable cuando bebe alcohol (terapia de aversin). Grupos de apoyo como Alcohlicos Annimos (AA). Estos grupos son dirigidos por personas que han superado su problema con la bebida. Estos grupos  brindan apoyo emocional, consejos y Optometrist. Algunas personas con esta afeccin se benefician del tratamiento combinado que se ofrece en algunos centros de tratamiento especializados en el abuso de sustancias. Siga estas instrucciones en su casa:  Medicamentos Use los medicamentos de venta libre y los recetados solamente como se lo haya indicado el mdico. Consulte antes de Corporate investment banker a Medical sales representative, hierbas o suplementos nuevos. Instrucciones generales Pdales a sus amistades y familiares que apoyen su decisin de mantenerse sobrio. Evite los State Farm que se sirve alcohol. Cree un plan para lidiar con las situaciones tentadoras. Asista a grupos de apoyo con regularidad. Practique pasatiempos o SUPERVALU INC gusten. No conduzca despus de beber alcohol. Cmo se previene? No beba alcohol si el mdico se lo prohbe. Si bebe alcohol: Limite la cantidad que bebe a lo siguiente: De 0 a 1 medida por da para las mujeres que no estn embarazadas. De 0 a 2 medidas por da para los hombres. Sepa cunta cantidad de alcohol hay en las bebidas que toma. En los [address], una medida equivale a una botella de cerveza de 12 oz (355 ml), un vaso de vino de 5 oz (148 ml) o un vaso de una bebida alcohlica de alta graduacin de 1 oz (44 ml). Si tiene una afeccin de salud mental, busque tratamiento. Lleve un estilo de vida saludable, que puede incorporar lo siguiente: Meditacin o respiracin profunda. Realizar actividad fsica. Pasar tiempo en contacto con la naturaleza. Escuchar msica. Charlar con algn familiar o amigo de confianza. Si eres adolescente: No bebas alcohol. Evita las reuniones en las que puedas tener la tentacin de beber alcohol. No tengas miedo de negarte si alguien te ofrece alcohol. Habla sin reservas acerca de por qu no quieres beber alcohol. S un ejemplo positivo para los dems al no consumir alcohol. Construye relaciones con amigos que no  beban. Dnde obtener ms informacin Substance Abuse and Mental Health Services Administration (Administracin de Servicios por Abuso de Sustancias y Salud Mental): RockToxic.pl Alcohlicos Annimos: CustomizedRugs.fi Comunquese con un mdico si: No puede tomar los Monsanto Company se lo han indicado. Sus sntomas empeoran o tiene sntomas de abstinencia cuando deja de beber. Vuelve a comenzar a beber (recada) y los sntomas empeoran. Solicite ayuda de inmediato si: Tiene pensamientos acerca de Runner, broadcasting/film/video o Physicist, medical a Economist. Busque ayuda de inmediato si alguna vez siente que puede hacerse dao a usted mismo o a otros, o tiene pensamientos de Patent examiner a su vida. Dirjase al centro de urgencias ms cercano o: Llame al 911. Llame a National Suicide Prevention Lifeline (Lnea Telefnica Nacional para la Prevencin del Suicidio) al [phone] o al 988. Est disponible las 24 horas del da. Enve un mensaje de texto a la lnea para casos de crisis al [phone]. Resumen El trastorno por consumo de bebidas alcohlicas es una afeccin en la que el consumo de alcohol altera la vida cotidiana. Las personas con esta afeccin consumen bebidas alcohlicas en exceso y no pueden controlar su consumo. El tratamiento puede incluir desintoxicacin, asesoramiento psicolgico, medicamentos y grupos de [place]. Pida ayuda a amigos y familiares. Evite situaciones en las que se sirva alcohol. Busque ayuda de inmediato si tiene pensamientos acerca de lastimarse a usted mismo o a Heritage manager  personas. Esta informacin no tiene Marine scientist el consejo del mdico. Asegrese de hacerle al mdico cualquier pregunta que tenga. Document Revised: 11/03/2021 Document Reviewed: 11/03/2021 Elsevier Patient Education  [place].

## 2022-04-21 NOTE — Telephone Encounter (Signed)
Interpreter assistance provided by Lynn Ito, ID number [id number].   Left message on voicemail to inform patient that we are calling to get appt scheduled for CT imaging.

## 2022-04-29 ENCOUNTER — Telehealth: Payer: Self-pay | Admitting: Internal Medicine

## 2022-04-29 NOTE — Telephone Encounter (Signed)
Dawn from [place] con preservice called in about a PA from insurance for chest ct. She states showing incomplete. Please call back

## 2022-04-29 NOTE — Telephone Encounter (Signed)
Are you aware of this 

## 2022-04-29 NOTE — Telephone Encounter (Signed)
Service [phone] Authorization CWCBJS:[id number] Auth Effective Date:09/14/2023Auth End Date:10/26/2022

## 2022-04-30 NOTE — Telephone Encounter (Signed)
Dawn stated this was completed under the wrong facility. Dawn provided correct information below.  QQP-[id number] [place] Para ID - [id number] Will be listed as [facility] .    Please advise.

## 2022-04-30 NOTE — Telephone Encounter (Signed)
FYI

## 2022-05-03 ENCOUNTER — Ambulatory Visit (HOSPITAL_COMMUNITY): Payer: Commercial Managed Care - HMO

## 2022-05-03 ENCOUNTER — Other Ambulatory Visit: Payer: Self-pay | Admitting: Physician Assistant

## 2022-05-03 DIAGNOSIS — R911 Solitary pulmonary nodule: Secondary | ICD-10-CM

## 2022-05-05 ENCOUNTER — Telehealth: Payer: Self-pay | Admitting: Internal Medicine

## 2022-05-05 NOTE — Telephone Encounter (Signed)
Copied from [place] [id number]. Topic: General - Other >> May 05, 2022  4:21 PM [place] B wrote: Reason for CRM:  Patient called in, has a referral to get a ct chest scan done, but GI imaging is saying they dont have an auth and cancelled her appt,but she has an [place] approved. Josem Kaufmann #[id number]. Please call back

## 2022-05-07 ENCOUNTER — Telehealth: Payer: Self-pay | Admitting: *Deleted

## 2022-05-07 NOTE — Telephone Encounter (Signed)
Interpreter assistance provided by [employer] ID# number [phone]  Left message on voicemail informing patient the [facility] has tried to reach out to patient to schedule an apt for her CT Scan.   Advised patient to call [facility] back to schedule the apt at [phone].

## 2022-05-19 NOTE — Telephone Encounter (Signed)
Called to reach pt in regards to updated info. Spoke w/ pt daughter Angela Nevin) who is pt alternative contact and that handle pt info. She states she was aware of scheduling and awaiting for RTC to update on insurance process for CT scan. Pt daughter was informed Josem Kaufmann approved and that appt for CT Chest has been scheduled for next Wed 05/26/22@ 11am w/ 10:30am arrive time. Pt daughter expressed understanding, informing pt will be informed of info.-------DD,RMA

## 2022-05-19 NOTE — Telephone Encounter (Signed)
Called to updated Auth number for order. Appt r/s for CT Chest on 05/26/22@ 11am w/ 10:30am arrival time. Will contact pt to notify of r/s appt. ----DD,RMA

## 2022-05-26 ENCOUNTER — Ambulatory Visit (HOSPITAL_COMMUNITY): Payer: Commercial Managed Care - HMO | Attending: Physician Assistant

## 2022-06-03 ENCOUNTER — Ambulatory Visit: Payer: Self-pay

## 2022-06-03 NOTE — Telephone Encounter (Signed)
Vicente Males # [id number]   Chief Complaint: shoulder pain Symptoms: R shoulder pain 10/10, worse when moving Frequency: 2 months Pertinent Negatives: NA Disposition: [] ED /[x] Urgent Care (no appt availability in office) / [] Appointment(In office/virtual)/ []  [facility] [facility]/ [] Home Care/ [] Refused Recommended Disposition /[] [facility]/ []  Follow-up with PCP Additional Notes: pt states she has tried patches and Tylenol and Ibuprofen and nothing helps. Advised no appt until 07/06/22. Scheduled UC appt tomorrow at 1600.   Reason for Disposition  [1] SEVERE pain AND [2] not improved 2 hours after pain medicine  Answer Assessment - Initial Assessment Questions 1. ONSET: "When did the pain start?"     2 months  2. LOCATION: "Where is the pain located?"     R shoulder 3. PAIN: "How bad is the pain?" (Scale 1-10; or mild, moderate, severe)   - MILD (1-3): doesn't interfere with normal activities   - MODERATE (4-7): interferes with normal activities (e.g., work or school) or awakens from sleep   - SEVERE (8-10): excruciating pain, unable to do any normal activities, unable to move arm at all due to pain     10 6. OTHER SYMPTOMS: "Do you have any other symptoms?" (e.g., neck pain, swelling, rash, fever, numbness, weakness)     Unable to raise up to brush hair  Protocols used: Shoulder Pain-A-AH

## 2022-06-04 ENCOUNTER — Encounter (HOSPITAL_COMMUNITY): Payer: Self-pay

## 2022-06-04 ENCOUNTER — Ambulatory Visit (HOSPITAL_COMMUNITY)
Admission: RE | Admit: 2022-06-04 | Discharge: 2022-06-04 | Disposition: A | Discharge status: Home / Self Care | Payer: Commercial Managed Care - HMO | Source: Ambulatory Visit | Attending: Internal Medicine | Admitting: Internal Medicine

## 2022-06-04 VITALS — BP 123/80 | HR 70 | Temp 98.1°F | Resp 15

## 2022-06-04 DIAGNOSIS — M25511 Pain in right shoulder: Secondary | ICD-10-CM | POA: Diagnosis not present

## 2022-06-04 HISTORY — DX: Pure hypercholesterolemia, unspecified: E78.00

## 2022-06-04 MED ORDER — PREDNISONE 20 MG PO TABS: 20.0000 mg | ORAL_TABLET | Freq: Every day | ORAL | 0 refills | Status: AC

## 2022-06-04 MED ORDER — METHOCARBAMOL 500 MG PO TABS: 500.0000 mg | ORAL_TABLET | Freq: Two times a day (BID) | ORAL | 0 refills | Status: DC | PRN

## 2022-06-04 NOTE — Telephone Encounter (Signed)
Noted pt has appointment today at 4

## 2022-06-04 NOTE — Discharge Instructions (Signed)
I have prescribed a muscle relaxer and prednisone which will help relax muscle and decrease inflammation.  Please be advised that muscle relaxer can cause drowsiness so do not drive, operate machinery, drink alcohol while taking this.  Follow-up with orthopedist for further evaluation and management.

## 2022-06-04 NOTE — ED Triage Notes (Signed)
Pt c/o right shoulder for 2 months. Denies injury, falls, or lifting. Pt reports pain started out 2 months ago was intermittent. Past week pain more constant. Esp pain with raising arm. Taking tylenol and ibuprofen for pain.

## 2022-06-04 NOTE — ED Provider Notes (Signed)
[place]    CSN: [id number] Arrival date & time: 06/04/22  1619      History   Chief Complaint Chief Complaint  Patient presents with   app 4   Shoulder Pain    HPI Natasha Roberts is a 61 y.o. female.   Patient presents with right shoulder pain that has been present for about 2 months.  Denies any obvious injury to the area.  Denies history of chronic shoulder pain.  Denies any numbness or tingling.  Pain has been intermittent.  She reports that she has used lidocaine patches, Tylenol, ibuprofen with minimal improvement of symptoms.   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 pylori infection 02/26/2021   Mixed hyperlipidemia 02/26/2021   Thrombocytopenia ([place]) 01/28/2021   Hypothyroidism 01/26/2021   Abdominal pain, epigastric 01/26/2021   Nausea without vomiting 01/26/2021   Elevated liver enzymes 01/26/2021    Past Surgical History:  Procedure Laterality Date   ABDOMINAL HYSTERECTOMY      OB History   No obstetric history on file.      Home Medications    Prior to Admission medications   Medication Sig Start Date End Date Taking? Authorizing Provider  methocarbamol (ROBAXIN) 500 MG tablet Take 1 tablet (500 mg total) by mouth 2 (two) times daily as needed for muscle spasms. 06/04/22  Yes Adel Burch, Hildred Alamin E, FNP  predniSONE (DELTASONE) 20 MG tablet Take 1 tablet (20 mg total) by mouth daily for 5 days. 06/04/22 06/09/22 Yes Delcia Spitzley, Michele Rockers, FNP  acetaminophen (TYLENOL) 325 MG tablet Take 2 tablets (650 mg total) by mouth every 6 (six) hours as needed for mild pain (or Fever >/= 101). 01/03/21   Cherene Altes, MD  atorvastatin  (LIPITOR) 10 MG tablet Take 1 tablet (10 mg total) by mouth daily. 02/11/21   Mayers, Cari S, PA-C  Cyanocobalamin (CVS B12 GUMMIES PO) Take by mouth.    [provider]  hydrocortisone cream 1 % Apply 1 application. topically.    [provider]  levothyroxine (SYNTHROID) 88 MCG tablet Take 1 tablet (88 mcg total) by mouth daily before breakfast. 03/10/22   Shamleffer, Melanie Crazier, MD  loperamide (IMODIUM A-D) 2 MG tablet Take 1 tablet (2 mg total) by mouth 4 (four) times daily as needed for diarrhea or loose stools. Patient not taking: Reported on 03/09/2022 08/25/21   Gildardo Pounds, NP  Multiple Vitamin (MULTIVITAMIN) tablet Take 1 tablet by mouth daily.    [provider]    Family History No family history on file.  Social History Social History   Tobacco Use   Smoking status: Every Day    Packs/day: 0.10    Types: Cigarettes    Start date: 04/16/1992   Smokeless tobacco: Never  Substance Use Topics   Alcohol use: Yes    Alcohol/week: 25.0 standard drinks of alcohol    Types: 25 Standard drinks or equivalent per week   Drug use: Never     Allergies   Patient has no known allergies.   Review of Systems Review of Systems Per HPI  Physical Exam Triage Vital Signs ED Triage Vitals  Enc  Vitals Group     BP 06/04/22 1703 123/80     Pulse Rate 06/04/22 1703 70     Resp 06/04/22 1703 15     Temp 06/04/22 1703 98.1 F (36.7 C)     Temp Source 06/04/22 1703 Oral     SpO2 06/04/22 1703 98 %     Weight --      Height --      Head Circumference --      Peak Flow --      Pain Score 06/04/22 1659 9     Pain Loc --      Pain Edu? --      Excl. in GC? --    No data found.  Updated Vital Signs BP 123/80 (BP Location: Left Arm)   Pulse 70   Temp 98.1 F (36.7 C) (Oral)   Resp 15   SpO2 98%   Visual Acuity Right Eye Distance:   Left Eye Distance:   Bilateral Distance:    Right Eye Near:   Left Eye Near:    Bilateral Near:      Physical Exam Constitutional:      General: She is not in acute distress.    Appearance: Normal appearance. She is not toxic-appearing or diaphoretic.  HENT:     Head: Normocephalic and atraumatic.  Eyes:     Extraocular Movements: Extraocular movements intact.     Conjunctiva/sclera: Conjunctivae normal.  Pulmonary:     Effort: Pulmonary effort is normal.  Musculoskeletal:     Comments: Patient has tenderness to palpation to anterior and posterior right shoulder most significantly.  Mild tenderness to lateral shoulder.  Pain with range of motion with abduction at 90 degrees.  No obvious swelling, discoloration, lacerations, abrasions noted.  Grip strength is 5/5.  Neurovascular intact.  Neurological:     General: No focal deficit present.     Mental Status: She is alert and oriented to person, place, and time. Mental status is at baseline.  Psychiatric:        Mood and Affect: Mood normal.        Behavior: Behavior normal.        Thought Content: Thought content normal.        Judgment: Judgment normal.      UC Treatments / Results  Labs (all labs ordered are listed, but only abnormal results are displayed) Labs Reviewed - No data to display  EKG   Radiology No results found.  Procedures Procedures (including critical care time)  Medications Ordered in UC Medications - No data to display  Initial Impression / Assessment and Plan / UC Course  I have reviewed the triage vital signs and the nursing notes.  Pertinent labs & imaging results that were available during my care of the patient were reviewed by me and considered in my medical decision making (see chart for details).     Given no obvious injury, do not think that imaging is necessary.  It is most likely muscular in nature.  I do think patient would benefit from muscle relaxer as this was prescribed for patient.  Advised patient muscle relaxer can cause drowsiness and do not drive, operate machinery, drink  alcohol while taking it.  Also think that the patient  would benefit from prednisone to decrease information given that symptoms have been refractory to over-the-counter medications.  After further review of patient's chart, it appears patient is being worked up for lung nodules found on imaging by PCP.  Currently awaiting CT scan.  Given unknown etiology of lung nodules, will only do a short course and low-dose of prednisone.  Patient was advised also to follow-up with orthopedist at provided contact information for further evaluation and management of shoulder pain.  She was given strict return precautions.  Patient verbalized understanding and was agreeable with plan. Final Clinical Impressions(s) / UC Diagnoses   Final diagnoses:  Acute pain of right shoulder     Discharge Instructions      I have prescribed a muscle relaxer and prednisone which will help relax muscle and decrease inflammation.  Please be advised that muscle relaxer can cause drowsiness so do not drive, operate machinery, drink alcohol while taking this.  Follow-up with orthopedist for further evaluation and management.    ED Prescriptions     Medication Sig Dispense Auth. Provider   methocarbamol (ROBAXIN) 500 MG tablet Take 1 tablet (500 mg total) by mouth 2 (two) times daily as needed for muscle spasms. 20 tablet [place], [place] E, [place]   predniSONE (DELTASONE) 20 MG tablet Take 1 tablet (20 mg total) by mouth daily for 5 days. 5 tablet [place], [place] E, [place]      I have reviewed the PDMP during this encounter.   Gustavus Bryant, [place] 06/04/22 [phone]

## 2022-06-23 ENCOUNTER — Encounter: Payer: Self-pay | Admitting: *Deleted

## 2022-06-23 NOTE — Progress Notes (Unsigned)
Called patient with a Spanish Interpreter to attempt to schedule an apt with [facility] for her CT Chest.  Authorization has been approved but expiring on 07/03/2022.  Also have left several messages with [facility] in attempt to see where the process is with scheduling the patient. Called [phone]. This number was left on my voicemail.   Left message with both patient and [facility] to return call.   Interpreter assistance provided by [place], [place] [id number]

## 2022-06-25 NOTE — Progress Notes (Unsigned)
[facility] reach out to patient for scheduling for CT chest, patient declined to schedule an appt at this time.

## 2022-07-12 IMAGING — CT CT ABD-PELV W/O CM
2 of 4 series · 16 of 46 positions shown, 18 images · non-contrast
Comparison: None.

CLINICAL DATA: Acute abdominal pain, nausea, and fever beginning
today.

EXAM:
CT ABDOMEN AND PELVIS WITHOUT CONTRAST
TECHNIQUE: Multidetector CT imaging of the abdomen and pelvis was performed
following the standard protocol without IV contrast.

[Series 3: abd/ pelvis 5.0 i30f 2 · axial · 0.83mm/px · z∈[-560,-115]mm · 13 of 97 slices shown, 15 images]
[im 4/97  soft-tissue]
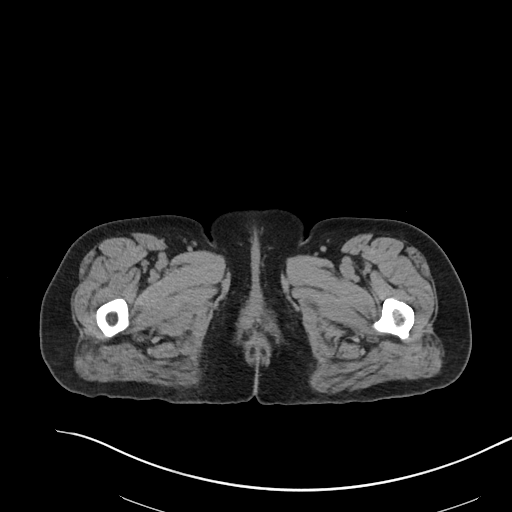
[im 4/97  bone]
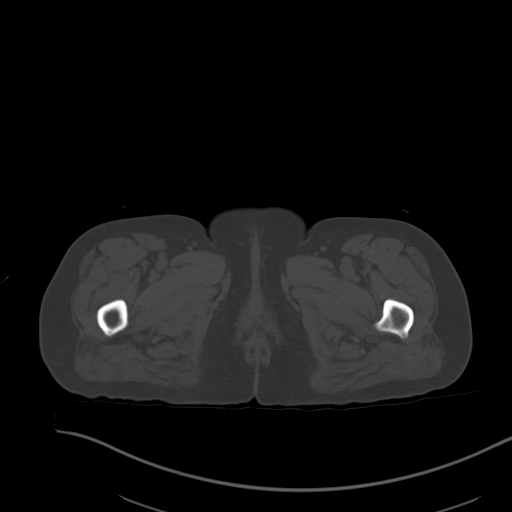
[im 12/97  soft-tissue]
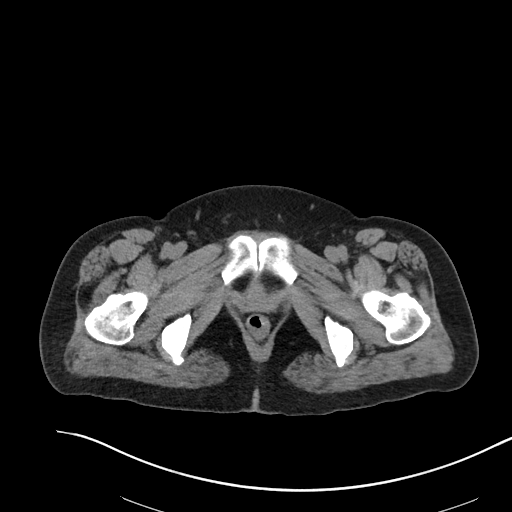
[im 20/97  soft-tissue]
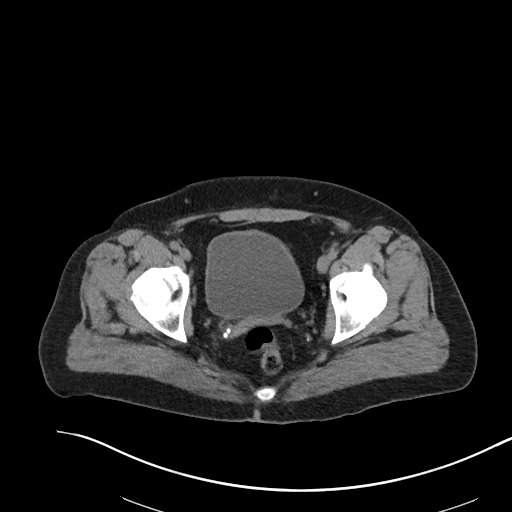
[im 27/97  soft-tissue]
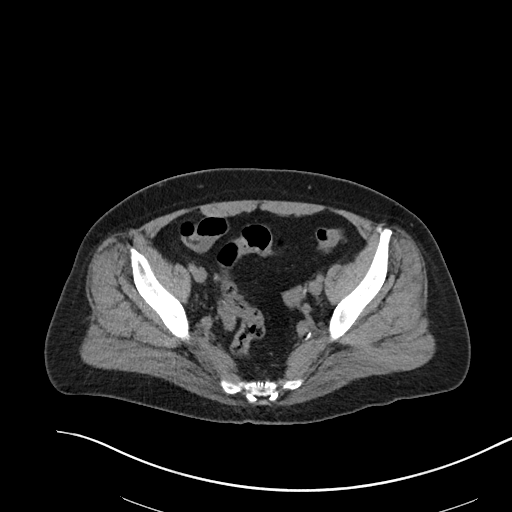
[im 35/97  soft-tissue]
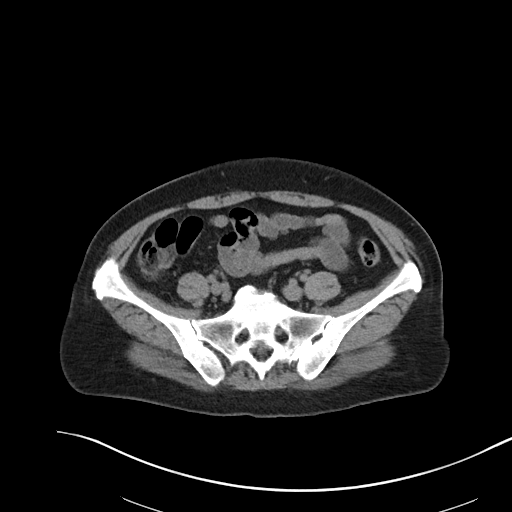
[im 43/97  soft-tissue]
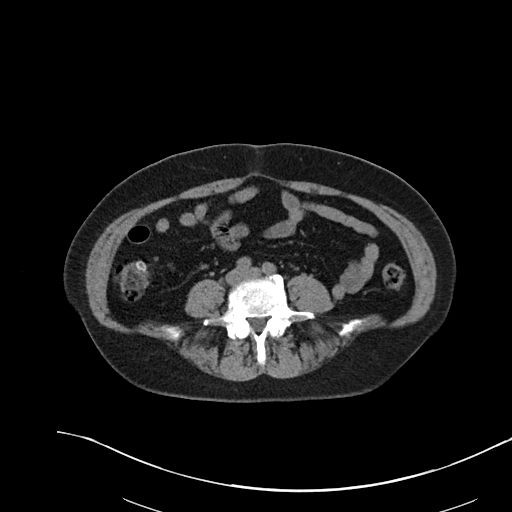
[im 50/97  soft-tissue]
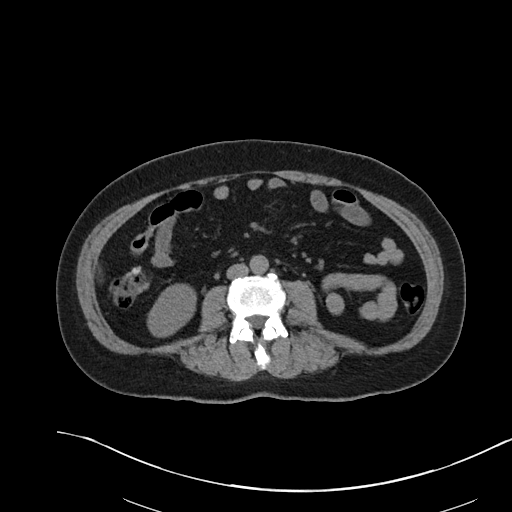
[im 54/97  soft-tissue]
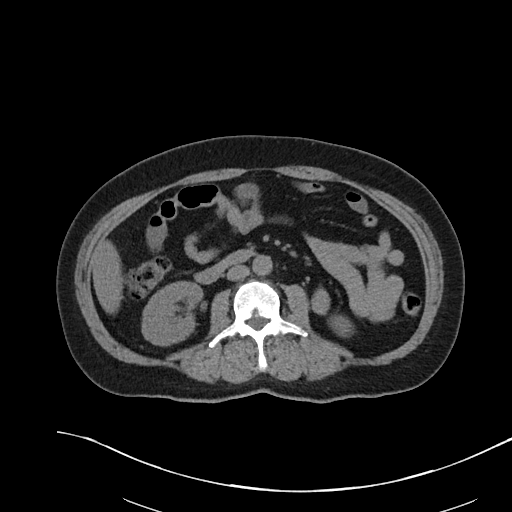
[im 62/97  soft-tissue]
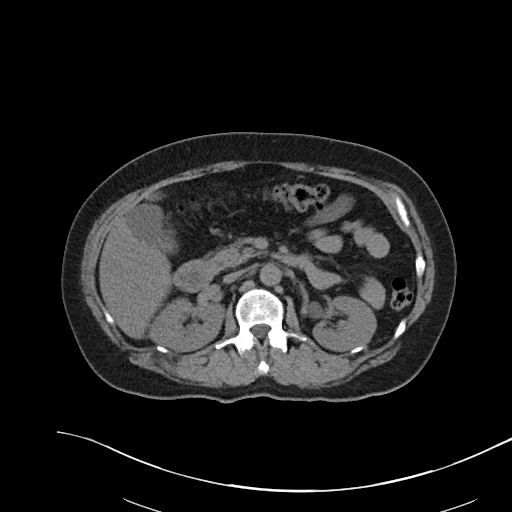
[im 62/97  bone]
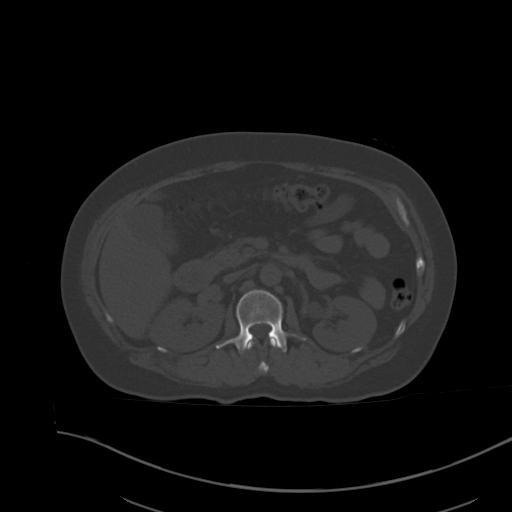
[im 70/97  soft-tissue]
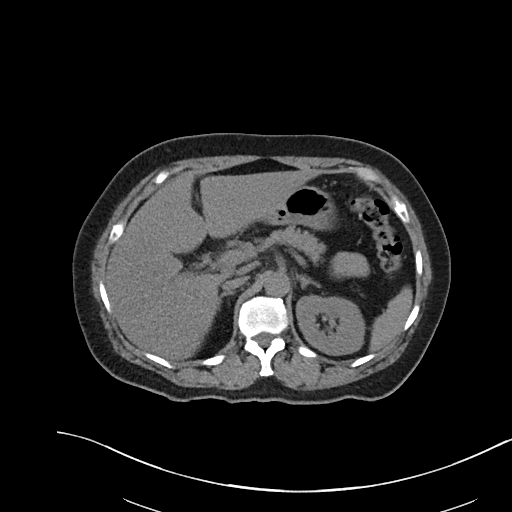
[im 77/97  soft-tissue]
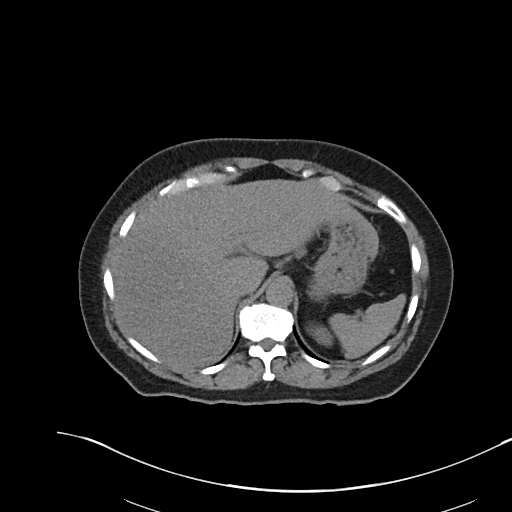
[im 85/97  soft-tissue]
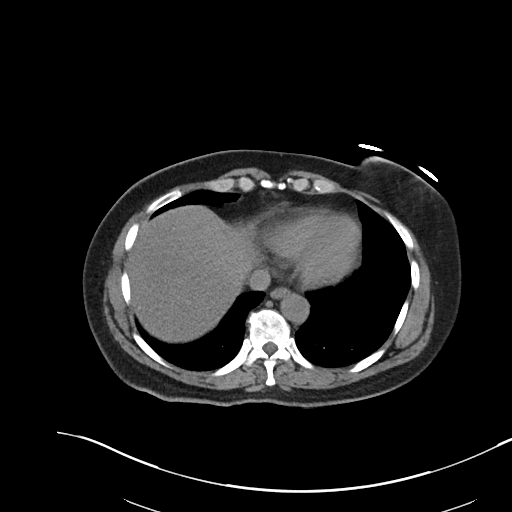
[im 93/97  soft-tissue]
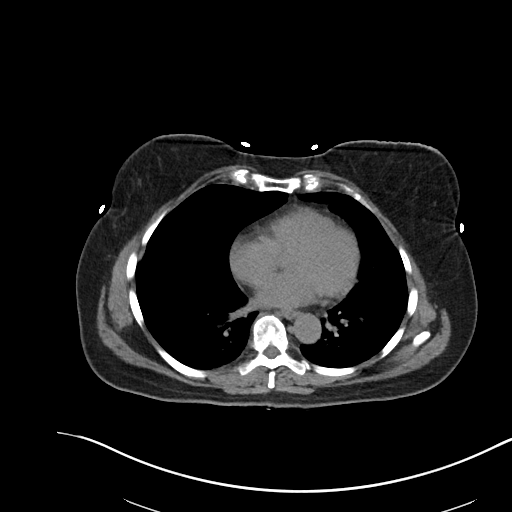

[Series 6: cor st · coronal · 0.71mm/px · 3 of 85 slices shown]
[im 29/85  soft-tissue]
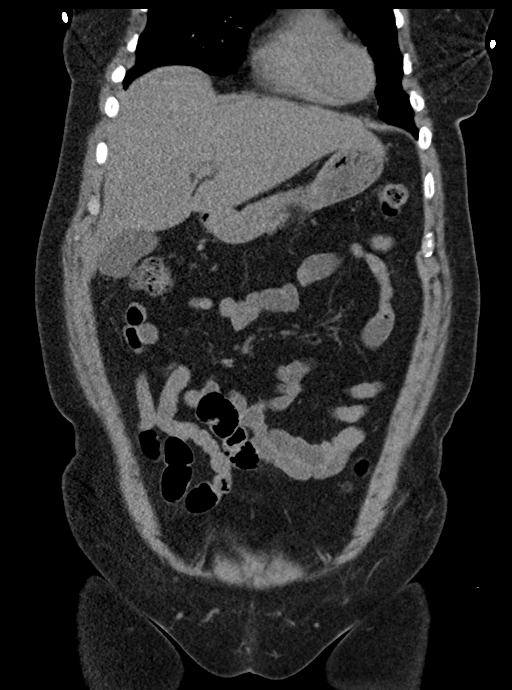
[im 38/85  soft-tissue]
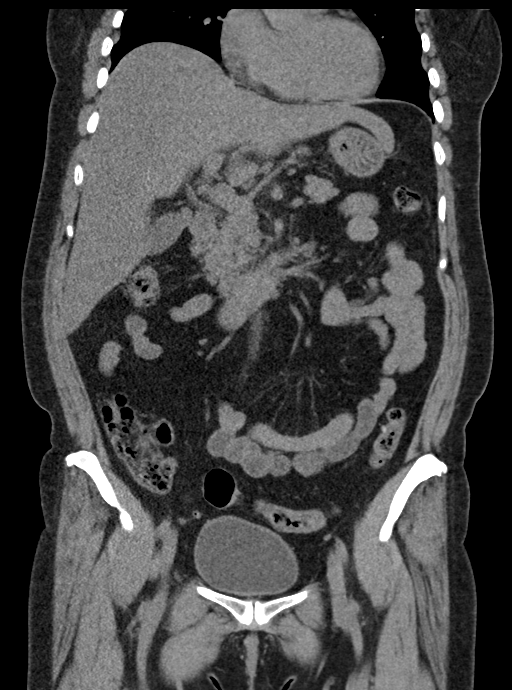
[im 47/85  soft-tissue]
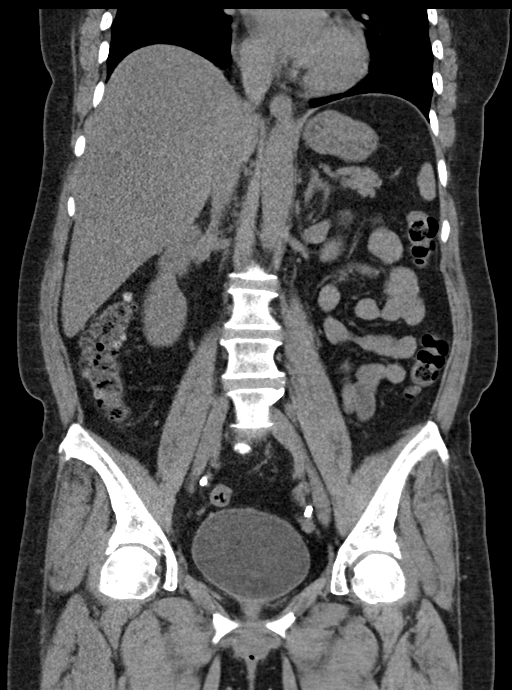

[16 of 46 positions shown; findings below may reference images not displayed]

FINDINGS: Lower chest: A few tiny pulmonary nodules are seen in the peripheral
right middle lobe measuring up to 3 mm.

Hepatobiliary: No mass visualized on this unenhanced exam.
Gallbladder is unremarkable. No evidence of biliary ductal
dilatation.

Pancreas: No mass or inflammatory process visualized on this
unenhanced exam.

Spleen:  Within normal limits in size.

Adrenals/Urinary tract: No evidence of urolithiasis or
hydronephrosis. Unremarkable unopacified urinary bladder.

Stomach/Bowel: No evidence of obstruction, inflammatory process, or
abnormal fluid collections. Normal appendix visualized.
Diverticulosis is seen predominately involving the right colon,
however there is no evidence of diverticulitis.

Vascular/Lymphatic: No pathologically enlarged lymph nodes
identified. No evidence of abdominal aortic aneurysm.

Reproductive: Prior hysterectomy noted. Adnexal regions are
unremarkable in appearance.

Other:  None.

Musculoskeletal:  No suspicious bone lesions identified.
IMPRESSION: No acute findings.

Colonic diverticulosis, without radiographic evidence of
diverticulitis.

Tiny right middle lobe pulmonary nodules, largest measuring 3 mm. No
follow-up needed if patient is low-risk (and has no known or
suspected primary neoplasm). Non-contrast chest CT can be considered
in 12 months if patient is high-risk. This recommendation follows
the consensus statement: Guidelines for Management of Incidental
Pulmonary Nodules Detected on CT Images: From the [HOSPITAL]

## 2022-07-12 IMAGING — DX DG CHEST 1V PORT
1 series · 1 of 1 positions shown · non-contrast
Comparison: None.

CLINICAL DATA: Questionable sepsis.

EXAM:
PORTABLE CHEST 1 VIEW

[chest ap]
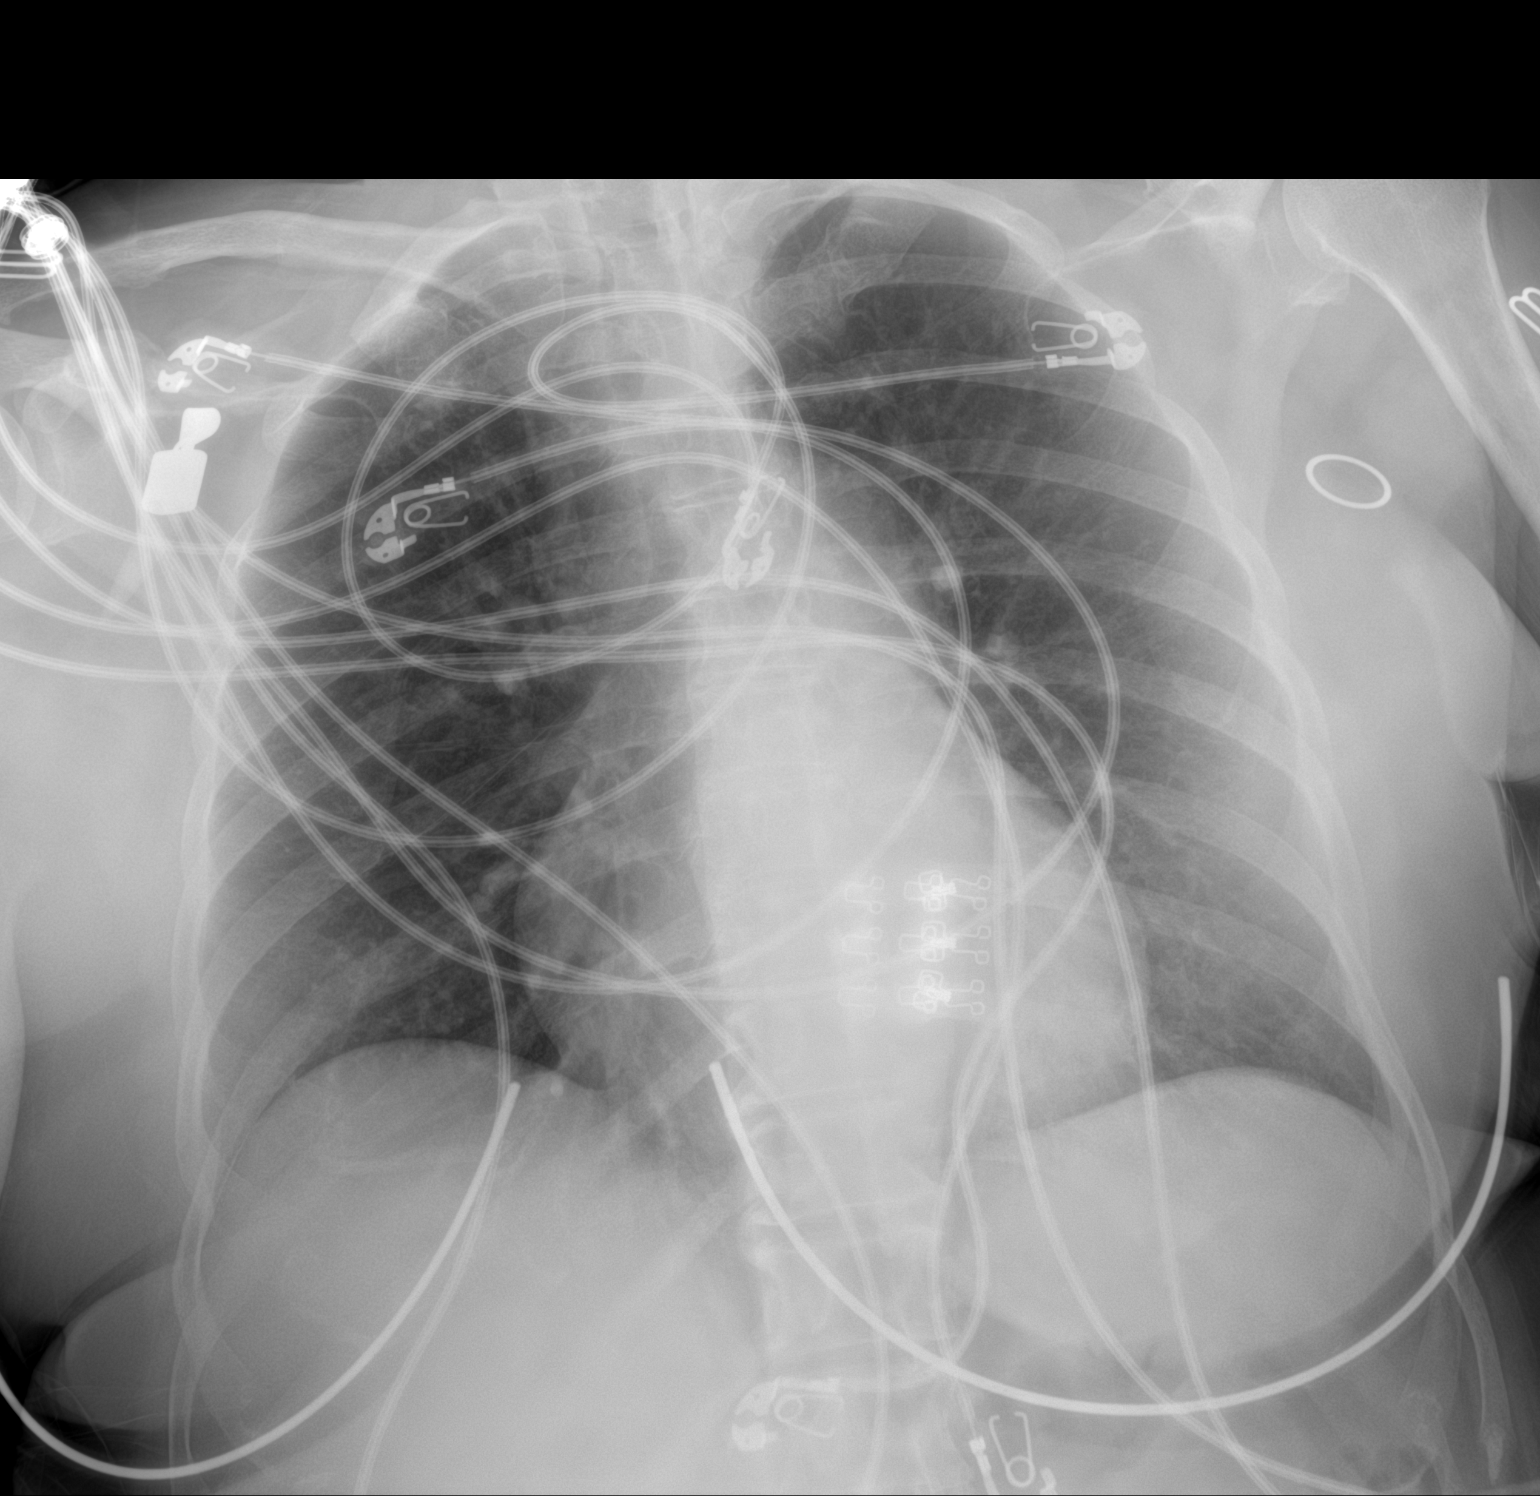

[1 of 1 positions shown; findings below may reference images not displayed]

FINDINGS: Multiple overlying radiopaque cardiac lead wires are noted. The
heart size and mediastinal contours are within normal limits. Both
lungs are clear. The visualized skeletal structures are
unremarkable.
IMPRESSION: No active disease.

## 2022-07-15 ENCOUNTER — Encounter: Payer: Self-pay | Admitting: Physician Assistant

## 2022-07-15 ENCOUNTER — Ambulatory Visit: Payer: Commercial Managed Care - HMO | Attending: Physician Assistant | Admitting: Physician Assistant

## 2022-07-15 VITALS — BP 142/82 | HR 86 | Wt 141.0 lb

## 2022-07-15 DIAGNOSIS — E559 Vitamin D deficiency, unspecified: Secondary | ICD-10-CM

## 2022-07-15 DIAGNOSIS — R454 Irritability and anger: Secondary | ICD-10-CM

## 2022-07-15 DIAGNOSIS — Z1331 Encounter for screening for depression: Secondary | ICD-10-CM | POA: Diagnosis not present

## 2022-07-15 DIAGNOSIS — F3289 Other specified depressive episodes: Secondary | ICD-10-CM

## 2022-07-15 DIAGNOSIS — R030 Elevated blood-pressure reading, without diagnosis of hypertension: Secondary | ICD-10-CM | POA: Diagnosis not present

## 2022-07-15 DIAGNOSIS — E038 Other specified hypothyroidism: Secondary | ICD-10-CM

## 2022-07-15 MED ORDER — CITALOPRAM HYDROBROMIDE 10 MG PO TABS: 10.0000 mg | ORAL_TABLET | Freq: Every day | ORAL | 1 refills | Status: DC

## 2022-07-15 NOTE — Patient Instructions (Signed)
Cmo sobrellevar la depresin en los adultos Managing Depression, Adult La depresin es una afeccin de salud mental que puede influir en los pensamientos, los sentimientos y [place]. Recibir el diagnstico de depresin puede traerle alivio si no saba por qu se senta o se comportaba de Chiropodist. Tambin podra hacer que se sienta abrumado. Encontrar formas de [employer] sntomas puede ayudarlo a sentirse ms positivo sobre el futuro. Cmo manejar los cambios en el estilo de vida Estar deprimido es difcil. La depresin puede aumentar el nivel de estrs diario. El estrs puede [employer] sntomas de la depresin. Es posible que crea que sus sntomas no se pueden controlar o que nunca mejorarn. Sin embargo, hay muchas cosas que puede intentar para ayudar a Chief Operating Officer sus sntomas. Hay esperanza. Control del estrs  El estrs es la reaccin del cuerpo ante los cambios y los acontecimientos de la vida, tanto buenos [place]. El estrs puede aumentar los sentimientos de depresin. Aprender a controlar el estrs puede ayudar a disminuir los sentimientos de depresin. Pruebe algunos de los siguientes enfoques para reducir Development worker, community (tcnicas de reduccin del estrs): Escuche msica que le guste y que lo inspire. Intente usar una aplicacin de meditacin o tome una clase de meditacin. Desarrolle una prctica que le ayude a conectarse con su ser espiritual. Camine al [employer], rece o vaya a un lugar de culto. Practique respiracin profunda. Para hacerlo, inhale lentamente por la nariz. Haga una pausa cuando alcance el mximo de la inhalacin durante unos segundos y luego exhale lentamente, dejando que los msculos se relajen. Reptalo tres o cuatro veces. Practique yoga para relajarse y trabajar con los msculos. Elija una tcnica de reduccin del estrs que le funcione. Desarrollar estas tcnicas lleva tiempo y prctica. Resrvese de 5 a 15 minutos por da para Associate Professor.  Algunos terapeutas pueden ofrecerle capacitacin para aprenderlas. Haga lo siguiente para ayudar a Sales executive estrs: Lleve un diario. Conozca sus lmites. Establezca lmites saludables para usted y [place], como decir "no" cuando crea que algo es [place]. Preste atencin a cmo reacciona ante ciertas situaciones. Es posible que no pueda controlar todo, pero s puede controlar su reaccin. Aada humor a su vida viendo comedias o programas cmicos. Hgase tiempo para las actividades que disfruta y que la [place]. Dedique menos tiempo al uso de dispositivos electrnicos, en especial por la noche, antes de acostarse. La luz de las pantallas puede hacer que su cerebro piense que es hora de levantarse en lugar de [place].  Medicamentos Los medicamentos, como los antidepresivos, suelen ser parte del tratamiento para la depresin. Hable con su farmacutico o mdico sobre [employer], suplementos y productos a base de hierbas que toma, sus posibles efectos secundarios, y qu medicamentos y otros productos que se pueden tomar al mismo tiempo sin correr Herbalist. Es importante que informe a su mdico si tiene efectos secundarios. Las [employer] mdico puede sugerirle terapia familiar, terapia de pareja o terapia individual como parte del tratamiento. Cmo reconocer los [employer] persona tiene una respuesta distinta al tratamiento para la depresin. A medida que se recupere de la depresin, puede comenzar a hacer lo siguiente: Tener ms inters en hacer sus actividades. Sentirse ms esperanzado. Tener ms energa. Consumir una cantidad ms regular de alimentos. Tener mejor atencin mental. Es importante reconocer si la depresin no mejora o si empeora. Los sntomas que tuvo en el comienzo pueden volver, por ejemplo: Cansancio. [place] demasiado o muy poco. Dormir demasiado o  muy poco. Sentirse agotado, agitado o desesperanzado. Dificultad para concentrarse o para tomar  decisiones. Tener dolores y molestias inexplicables. Sentirse irritable, enojado o agresivo. Si usted o sus familiares advierten que estos sntomas regresan, infrmeselo al mdico de inmediato. Siga estas indicaciones en su casa: Actividad Tr[place] de Social research officer, government forma de ejercicio todos los [place], Corporate treasurer. Pruebe yoga, meditacin mindfulness u otras tcnicas de reduccin del estrs. Participe en actividades grupales si puede. Estilo de vida [place] lo suficiente. Reduzca o deje de consumir cafena, tabaco, bebidas alcohlicas y cualquier otra sustancia potencialmente peligrosa. Siga una dieta saludable que incluya abundantes frutas, verduras, cereales integrales, productos lcteos descremados y protenas magras. Limite el consumo de alimentos con alto contenido de grasas slidas, azcar agregado y sal (sodio). Indicaciones generales Use los medicamentos de venta libre y los recetados solamente como se lo haya indicado el mdico. Concurra a todas las visitas de seguimiento. Es importante que el mdico controle su estado de nimo, comportamiento y Media planner. El mdico puede necesitar cambios en su tratamiento. Dnde obtener apoyo Hablar con [employer] amigos y familiares pueden ser fuentes de apoyo y [place]. Hable con amigos o familiares de confianza sobre su afeccin. Explqueles cules son sus sntomas e infrmeles que est trabajando con un mdico para tratar la depresin. Dgales a sus amigos y familiares cmo pueden ayudarlo. Finanzas Busque proveedores de [employer] se adapten a su situacin financiera. Hable con el mdico si le preocupa el acceso a alimentos, vivienda o medicamentos. Llame a su aseguradora para obtener informacin sobre sus copagos y su plan de medicamentos recetados. Dnde obtener ms informacin Puede encontrar apoyo en su zona en: Anxiety and Depression Association of [place] [ADAA] (Asociacin de Ansiedad y Depresin de los Estados Unidos):  adaa.org Mental Health [place] (Salud Mental de los [place]): mentalhealthamerica.net The First American on Mental Illness (Alianza Nacional Sobre Enfermedades Mentales): nami.org Comunquese con un mdico si: Deja de tomar los medicamentos antidepresivos y tiene alguno de estos sntomas: Nuseas. Dolor de [place]. Desvanecimiento. Escalofros y dolores corporales. Imposibilidad de dormir (insomnio). Usted o algn amigo o familiar advierten que su depresin est empeorando. Solicite ayuda de inmediato si: Piensa acerca de lastimarse o lastimar a Economist. Busque ayuda de inmediato si alguna vez siente que puede hacerse dao a usted mismo o a otros, o tiene pensamientos de Patent examiner a su vida. Dirjase al centro de urgencias ms cercano o: Llame al 911. Llame a National Suicide Prevention Lifeline (Lnea Telefnica Nacional para la Prevencin del Suicidio) al 931 066 7746 o al 988. Est disponible las 24 horas del da. Enve un mensaje de texto a la lnea para casos de crisis al 5141551085. Esta informacin no tiene Theme park manager el consejo del mdico. Asegrese de hacerle al mdico cualquier pregunta que tenga. Document Revised: 12/29/2021 Document Reviewed: 12/29/2021 Elsevier Patient Education  2023 [employer].

## 2022-07-15 NOTE — Progress Notes (Signed)
Patient ID: Natasha Roberts, female   DOB: 1960-09-30, 61 y.o.   MRN: [id number]     Natasha Roberts, is a 61 y.o. female  [id number]  [id number]  DOB - February 13, 1961  Chief Complaint  Patient presents with   Depression       Subjective:   Natasha Roberts is a 61 y.o. female here today for a 3 month bout of depression.  She does not know of any precipitating factors.  She feels more irritable than what is normal for her. 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  She has had an experience with depression about 4 years ago when she was living in [place].  At that time, she was prescribed citalopram which worked well for her.  She took 10mg .  She took it for about 2 years then did well without it for the past 2 years.  Over the past few months she has found that she is feeling that way again.  She is more tearful than usual.  She denies SI/HI.    No problems updated.  ALLERGIES: No Known Allergies  PAST MEDICAL HISTORY: Past Medical History:  Diagnosis Date   High cholesterol    Thyroid disease hypothyroidism    MEDICATIONS AT HOME: Prior to Admission medications   Medication Sig Start Date End Date Taking? Authorizing Provider  acetaminophen (TYLENOL) 325 MG tablet Take 2 tablets (650 mg total) by mouth every 6 (six) hours as needed for mild pain (or Fever >/= 101). 01/03/21  Yes 01/05/21, MD  atorvastatin (LIPITOR) 10 MG tablet Take 1 tablet (10 mg total) by mouth daily. 02/11/21  Yes Mayers, Cari S, PA-C  citalopram (CELEXA) 10 MG tablet Take 1 tablet (10 mg total) by mouth daily. 07/15/22  Yes Nils Thor, 07/17/22 M, PA-C  Cyanocobalamin (CVS B12 GUMMIES PO) Take by mouth.   Yes [provider]  hydrocortisone cream 1 % Apply 1 application. topically.   Yes [provider]  levothyroxine (SYNTHROID) 88 MCG tablet Take 1 tablet (88 mcg total) by mouth daily before breakfast. 03/10/22  Yes Shamleffer, 03/12/22, MD   loperamide (IMODIUM A-D) 2 MG tablet Take 1 tablet (2 mg total) by mouth 4 (four) times daily as needed for diarrhea or loose stools. 08/25/21  Yes 10/23/21, NP  methocarbamol (ROBAXIN) 500 MG tablet Take 1 tablet (500 mg total) by mouth 2 (two) times daily as needed for muscle spasms. 06/04/22  Yes [place], 06/06/22 E, FNP  Multiple Vitamin (MULTIVITAMIN) tablet Take 1 tablet by mouth daily.   Yes [provider]    ROS: Neg HEENT Neg resp Neg cardiac Neg GI Neg GU Neg MS Neg psych Neg neuro  Objective:   Vitals:   07/15/22 1617 07/15/22 1633  BP: (!) 162/101 (!) 142/82  Pulse: 86   SpO2: 99%   Weight: 141 lb (64 kg)    Exam General appearance : Awake, alert, not in any distress. Speech Clear. Not toxic looking HEENT: Atraumatic and Normocephalic Neck: Supple, no JVD. No cervical lymphadenopathy.  Chest: Good air entry bilaterally, CTAB.  No rales/rhonchi/wheezing CVS: S1 S2 regular, no murmurs.  Extremities: B/L Lower Ext shows no edema, both legs are warm to touch Neurology: Awake alert, and oriented X 3, CN II-XII intact, Non focal Skin: No Rash  Data Review No results found for: "HGBA1C"  Assessment & Plan   1. Positive depression screening - citalopram (CELEXA) 10 MG tablet; Take 1 tablet (  10 mg total) by mouth daily.  Dispense: 90 tablet; Refill: 1 - Thyroid Panel With TSH  2. Vitamin D deficiency - Vitamin D, 25-hydroxy  3. Irritable - Thyroid Panel With TSH  4. Elevated blood pressure reading Check BP OOO and we will review at next visit  5. Other depression Take 1/2 tab daily for 1 week then increase dose - citalopram (CELEXA) 10 MG tablet; Take 1 tablet (10 mg total) by mouth daily.  Dispense: 90 tablet; Refill: 1 - Thyroid Panel With TSH  6. Other specified hypothyroidism Will adjust dose of synthroid if needed - Thyroid Panel With TSH  AMN interpreters "Monica" used and additional time performing visit was required.   Return  for with me or PCP in 1 month to recheck depression.  The patient was given clear instructions to go to ER or return to medical center if symptoms don't improve, worsen or new problems develop. The patient verbalized understanding. The patient was told to call to get lab results if they haven't heard anything in the next week.      [place]ian [place], PA-C [facility] [place], [place] [phone]   07/16/2022, 10:05 AM

## 2022-07-16 ENCOUNTER — Other Ambulatory Visit: Payer: Self-pay | Admitting: Physician Assistant

## 2022-07-16 LAB — THYROID PANEL WITH TSH
Free Thyroxine Index: 1.9 (ref 1.2–4.9)
T3 Uptake Ratio: 28 % (ref 24–39)
T4, Total: 6.7 ug/dL (ref 4.5–12.0)
TSH: 1.81 u[IU]/mL (ref 0.450–4.500)

## 2022-07-16 LAB — VITAMIN D 25 HYDROXY (VIT D DEFICIENCY, FRACTURES): Vit D, 25-Hydroxy: 19.6 ng/mL — ABNORMAL LOW (ref 30.0–100.0)

## 2022-07-16 MED ORDER — VITAMIN D (ERGOCALCIFEROL) 1.25 MG (50000 UNIT) PO CAPS: 50000.0000 [IU] | ORAL_CAPSULE | ORAL | 0 refills | Status: DC

## 2022-08-14 ENCOUNTER — Ambulatory Visit (HOSPITAL_COMMUNITY)
Admission: EM | Admit: 2022-08-14 | Discharge: 2022-08-14 | Disposition: A | Discharge status: Home / Self Care | Payer: Commercial Managed Care - HMO | Attending: Urgent Care | Admitting: Urgent Care

## 2022-08-14 ENCOUNTER — Encounter (HOSPITAL_COMMUNITY): Payer: Self-pay

## 2022-08-14 DIAGNOSIS — J0100 Acute maxillary sinusitis, unspecified: Secondary | ICD-10-CM

## 2022-08-14 DIAGNOSIS — J3489 Other specified disorders of nose and nasal sinuses: Secondary | ICD-10-CM

## 2022-08-14 DIAGNOSIS — H6591 Unspecified nonsuppurative otitis media, right ear: Secondary | ICD-10-CM | POA: Diagnosis not present

## 2022-08-14 MED ORDER — BENZONATATE 100 MG PO CAPS: 100.0000 mg | ORAL_CAPSULE | Freq: Three times a day (TID) | ORAL | 0 refills | Status: DC

## 2022-08-14 MED ORDER — GUAIFENESIN ER 600 MG PO TB12: 600.0000 mg | ORAL_TABLET | Freq: Two times a day (BID) | ORAL | 0 refills | Status: DC | PRN

## 2022-08-14 MED ORDER — FLUTICASONE PROPIONATE 50 MCG/ACT NA SUSP: 1.0000 | Freq: Two times a day (BID) | NASAL | 0 refills | Status: DC

## 2022-08-14 MED ORDER — AMOXICILLIN-POT CLAVULANATE 875-125 MG PO TABS: 1.0000 | ORAL_TABLET | Freq: Two times a day (BID) | ORAL | 0 refills | Status: AC

## 2022-08-14 NOTE — Discharge Instructions (Signed)
You have a sinus/ lower respiratory tract infection. Please start taking the antibiotic, Augmentin, twice daily with food. Take it for all 10 days, do not stop early just because you feel better. Take an over the counter probiotic or yogurt daily to help prevent diarrhea/ yeast infection. Start taking mucinex twice daily to help clear up the mucous. Use Flonase daily to help with inflammation of the nasal passage. It is also recommended that you use nasal saline/ sinus washes to cleans the sinus passages. Hot steam from a shower or vaporizer may also be beneficial to help open up the upper airway. Eucalyptus can be helpful. I have called in a cough medication to take up to three times daily as needed for cough. If any worsening symptoms such as headache, fever, or shortness of breath, please go to an in person evaluation/ urgent care.

## 2022-08-14 NOTE — ED Provider Notes (Signed)
[facility]    CSN: [id number] Arrival date & time: 08/14/22  1247      History   Chief Complaint Chief Complaint  Patient presents with   Cough   Sore Throat    HPI Natasha Roberts is a 61 y.o. female.   Pleasant 61 year old female presents today with concern of cold-like symptoms for the past 15 days.  She states for the past 4, she started having a sore throat and fever as well.  She has not checked her temperature at home.  She does have a cough that is productive of green phlegm.  She has been taking over-the-counter medications that have helped with her symptoms temporarily, but after 2 weeks is still concerned with sinus congestion, postnasal drainage, ear pressure.  She denies shortness of breath or chest pain.   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 pylori infection 02/26/2021   Mixed hyperlipidemia 02/26/2021   Thrombocytopenia (HCC) 01/28/2021   Hypothyroidism 01/26/2021   Abdominal pain, epigastric 01/26/2021   Nausea without vomiting 01/26/2021   Elevated liver enzymes 01/26/2021    Past Surgical History:  Procedure Laterality Date   ABDOMINAL HYSTERECTOMY      OB History   No obstetric history on file.      Home Medications    Prior to Admission medications   Medication Sig Start Date End Date Taking? Authorizing Provider  amoxicillin-clavulanate (AUGMENTIN) 875-125 MG tablet Take 1 tablet by mouth 2 (two) times daily with a meal for 10 days. 08/14/22 08/24/22 Yes Rileyann Florance L, PA  benzonatate (TESSALON) 100 MG capsule Take 1 capsule (100 mg total) by mouth every 8 (eight) hours. 08/14/22  Yes Keilyn Haggard L, PA   fluticasone (FLONASE) 50 MCG/ACT nasal spray Place 1 spray into both nostrils in the morning and at bedtime. 08/14/22  Yes Luanne Krzyzanowski L, PA  guaiFENesin (MUCINEX) 600 MG 12 hr tablet Take 1 tablet (600 mg total) by mouth 2 (two) times daily as needed for cough or to loosen phlegm. 08/14/22  Yes Amariyana Heacox L, PA  Vitamin D, Ergocalciferol, (DRISDOL) 1.25 MG (50000 UNIT) CAPS capsule Take 1 capsule (50,000 Units total) by mouth every 7 (seven) days. 07/16/22   Anders Simmonds, PA-C  acetaminophen (TYLENOL) 325 MG tablet Take 2 tablets (650 mg total) by mouth every 6 (six) hours as needed for mild pain (or Fever >/= 101). 01/03/21   Lonia Blood, MD  atorvastatin (LIPITOR) 10 MG tablet Take 1 tablet (10 mg total) by mouth daily. 02/11/21   Mayers, Cari S, PA-C  citalopram (CELEXA) 10 MG tablet Take 1 tablet (10 mg total) by mouth daily. 07/15/22   Anders Simmonds, PA-C  Cyanocobalamin ([employer] B12 GUMMIES PO) Take by mouth.    [provider]  hydrocortisone cream 1 % Apply 1 application. topically.    [provider]  levothyroxine (SYNTHROID) 88 MCG tablet Take 1 tablet (88 mcg total) by mouth daily before breakfast. 03/10/22   Shamleffer, Konrad Dolores, MD  loperamide (IMODIUM A-D) 2 MG tablet Take 1 tablet (2 mg total) by mouth 4 (four) times daily as needed for diarrhea  or loose stools. 08/25/21   Claiborne Rigg, NP  methocarbamol (ROBAXIN) 500 MG tablet Take 1 tablet (500 mg total) by mouth 2 (two) times daily as needed for muscle spasms. 06/04/22   Gustavus Bryant, FNP  Multiple Vitamin (MULTIVITAMIN) tablet Take 1 tablet by mouth daily.    [provider]    Family History History reviewed. No pertinent family history.  Social History Social History   Tobacco Use   Smoking status: Every Day    Packs/day: 0.10    Types: Cigarettes    Start date: 04/16/1992   Smokeless tobacco: Never  Substance Use Topics   Alcohol use: Yes    Alcohol/week: 25.0  standard drinks of alcohol    Types: 25 Standard drinks or equivalent per week   Drug use: Never     Allergies   Patient has no known allergies.   Review of Systems Review of Systems  Respiratory:  Positive for cough.   As per HPI   Physical Exam Triage Vital Signs ED Triage Vitals [08/14/22 1547]  Enc Vitals Group     BP (!) 152/104     Pulse Rate (!) 120     Resp 16     Temp 100.1 F (37.8 C)     Temp Source Oral     SpO2 100 %     Weight      Height      Head Circumference      Peak Flow      Pain Score      Pain Loc      Pain Edu?      Excl. in GC?    No data found.  Updated Vital Signs BP (!) 152/104 (BP Location: Left Arm)   Pulse (!) 120   Temp 100.1 F (37.8 C) (Oral)   Resp 16   SpO2 100%   Visual Acuity Right Eye Distance:   Left Eye Distance:   Bilateral Distance:    Right Eye Near:   Left Eye Near:    Bilateral Near:     Physical Exam Vitals and nursing note reviewed.  Constitutional:      General: She is not in acute distress.    Appearance: She is well-developed and normal weight. She is ill-appearing. She is not toxic-appearing or diaphoretic.  HENT:     Head: Normocephalic and atraumatic.     Right Ear: Ear canal normal. No drainage, swelling or tenderness. A middle ear effusion is present. Tympanic membrane is erythematous.     Left Ear: Ear canal normal. No drainage, swelling or tenderness. A middle ear effusion is present. Tympanic membrane is not erythematous.     Ears:     Comments: Air fluid levels to R TM, mild erythema with yellow mucoid plug    Nose: Congestion and rhinorrhea present.     Right Turbinates: Enlarged and swollen.     Left Turbinates: Enlarged and swollen.     Right Sinus: Maxillary sinus tenderness and frontal sinus tenderness present.     Left Sinus: Maxillary sinus tenderness and frontal sinus tenderness present.     Mouth/Throat:     Mouth: Mucous membranes are moist. No oral lesions.     Pharynx:  Uvula midline. Posterior oropharyngeal erythema present. No pharyngeal swelling, oropharyngeal exudate or uvula swelling.     Tonsils: No tonsillar exudate.     Comments: Thick mucoid post nasal drainage noted Eyes:     Conjunctiva/sclera: Conjunctivae normal.  Pupils: Pupils are equal, round, and reactive to light.  Neck:     Thyroid: No thyromegaly.  Cardiovascular:     Rate and Rhythm: Regular rhythm. Tachycardia present.     Heart sounds: Normal heart sounds. No murmur heard.    No friction rub. No gallop.  Pulmonary:     Effort: Pulmonary effort is normal. No respiratory distress.     Breath sounds: Normal breath sounds. No stridor. No wheezing, rhonchi or rales.  Chest:     Chest wall: No tenderness.  Abdominal:     General: Bowel sounds are normal. There is no distension.     Palpations: Abdomen is soft. There is no mass.     Tenderness: There is no abdominal tenderness. There is no guarding or rebound.     Hernia: No hernia is present.  Musculoskeletal:     Cervical back: Normal range of motion and neck supple.  Lymphadenopathy:     Cervical: No cervical adenopathy.  Skin:    General: Skin is warm and dry.     Capillary Refill: Capillary refill takes less than 2 seconds.     Coloration: Skin is not pale.     Findings: No erythema or rash.  Neurological:     General: No focal deficit present.     Mental Status: She is alert and oriented to person, place, and time.  Psychiatric:        Mood and Affect: Mood normal.      UC Treatments / Results  Labs (all labs ordered are listed, but only abnormal results are displayed) Labs Reviewed - No data to display  EKG   Radiology No results found.  Procedures Procedures (including critical care time)  Medications Ordered in UC Medications - No data to display  Initial Impression / Assessment and Plan / UC Course  I have reviewed the triage vital signs and the nursing notes.  Pertinent labs & imaging results  that were available during my care of the patient were reviewed by me and considered in my medical decision making (see chart for details).     Acute maxillary sinusitis - pt with reproducible tenderness to her maxillary sinuses.  Suspect this is the cause of the postnasal drainage and cough.  Lungs are clear.  Patient oxygenating well.  Will start Augmentin, humidification and sinus flushes. Mucoid otitis media with right ear effusion -likely secondary to #1.  Augmentin and nasal sprays discussed. Nasal congestion with rhinorrhea -treatment as above. Tachycardia -secondary to fever.  Patient to monitor heart rate and blood pressure at home, should respond as the infection improves.  Encouraged patient to stop taking over-the-counter medications that may increase her heart rate or blood pressure.  Plain Mucinex preferred to multisymptom medications.  Should tachycardia or elevated blood pressure persist, follow up with PCP or head to ER  Final Clinical Impressions(s) / UC Diagnoses   Final diagnoses:  Acute non-recurrent maxillary sinusitis  Mucoid otitis media of right ear with effusion  Nasal congestion with rhinorrhea     Discharge Instructions      You have a sinus/ lower respiratory tract infection. Please start taking the antibiotic, Augmentin, twice daily with food. Take it for all 10 days, do not stop early just because you feel better. Take an over the counter probiotic or yogurt daily to help prevent diarrhea/ yeast infection. Start taking mucinex twice daily to help clear up the mucous. Use Flonase daily to help with inflammation of the nasal passage. It  is also recommended that you use nasal saline/ sinus washes to cleans the sinus passages. Hot steam from a shower or vaporizer may also be beneficial to help open up the upper airway. Eucalyptus can be helpful. I have called in a cough medication to take up to three times daily as needed for cough. If any worsening symptoms  such as headache, fever, or shortness of breath, please go to an in person evaluation/ urgent care.      ED Prescriptions     Medication Sig Dispense Auth. Provider   amoxicillin-clavulanate (AUGMENTIN) 875-125 MG tablet Take 1 tablet by mouth 2 (two) times daily with a meal for 10 days. 20 tablet Kalkidan Caudell L, PA   fluticasone (FLONASE) 50 MCG/ACT nasal spray Place 1 spray into both nostrils in the morning and at bedtime. 11.1 mL Willella Harding L, PA   guaiFENesin (MUCINEX) 600 MG 12 hr tablet Take 1 tablet (600 mg total) by mouth 2 (two) times daily as needed for cough or to loosen phlegm. 20 tablet Milani Lowenstein L, PA   benzonatate (TESSALON) 100 MG capsule Take 1 capsule (100 mg total) by mouth every 8 (eight) hours. 21 capsule Khalfani Weideman L, PA      PDMP not reviewed this encounter.   Maretta Bees, [place] 08/14/22 1615

## 2022-08-14 NOTE — ED Triage Notes (Signed)
Pt presents to the office for sore throat and fever for 1-2 weeks. Pt states she is taking OTC medication with no relief.

## 2022-08-27 ENCOUNTER — Ambulatory Visit: Payer: Commercial Managed Care - HMO | Admitting: Internal Medicine

## 2022-09-07 ENCOUNTER — Ambulatory Visit: Payer: Commercial Managed Care - HMO | Admitting: Internal Medicine

## 2022-09-07 VITALS — BP 120/80 | HR 94 | Ht 62.0 in | Wt 140.8 lb

## 2022-09-07 DIAGNOSIS — E039 Hypothyroidism, unspecified: Secondary | ICD-10-CM

## 2022-09-07 DIAGNOSIS — E890 Postprocedural hypothyroidism: Secondary | ICD-10-CM | POA: Diagnosis not present

## 2022-09-07 NOTE — Progress Notes (Unsigned)
Name: Natasha Roberts  MRN/ DOB: [id number], 1960-08-26    Age/ Sex: 62 y.o., female    PCP: Ladell Pier, MD   Reason for Endocrinology Evaluation: Hypothyroidism     Date of Initial Endocrinology Evaluation: 11/13/2021    HPI: Ms. Natasha Roberts is a 62 y.o. female with a past medical history of hypothyroid and dyslipidemia. The patient presented for initial endocrinology clinic visit on 11/13/2021 for consultative assistance with her Hypothyroidism.      She has been diagnosed with hyperthyroidism , she is S/P RAI ablation   16 years ago. She was started on Levothyroxine immediately after radiation    In 08/2021 was noted to have low TSH and levothyroxine decreased from 100 to 88 mcg , Two months prior to that her TSH was elevated at 26.7 uIU/mL due to lack of insurance .   No prior dx of cardiac arhythmia or osteoporosis   No Fh of thyroid disease     SUBJECTIVE:    Today (09/07/22):  Natasha Roberts is here for a follow up on hypothyroidism  She had URI around around new year No local neck swelling  Denies constipation  Has occasional palpitations  Denies tremors   Levothyroxine 88 mcg daily    HISTORY:  Past Medical History:  Past Medical History:  Diagnosis Date   High cholesterol    Thyroid disease hypothyroidism   Past Surgical History:  Past Surgical History:  Procedure Laterality Date   ABDOMINAL HYSTERECTOMY      Social History:  reports that she has been smoking cigarettes. She started smoking about 30 years ago. She has been smoking an average of .1 packs per day. She has never used smokeless tobacco. She reports current alcohol use of about 25.0 standard drinks of alcohol per week. She reports that she does not use drugs. Family History: family history is not on file.   HOME MEDICATIONS: Allergies as of 09/07/2022   No Known Allergies      Medication List        Accurate as of September 07, 2022  2:43 PM. If you have any questions, ask  your nurse or doctor.          acetaminophen 325 MG tablet Commonly known as: TYLENOL Take 2 tablets (650 mg total) by mouth every 6 (six) hours as needed for mild pain (or Fever >/= 101).   atorvastatin 10 MG tablet Commonly known as: LIPITOR Take 1 tablet (10 mg total) by mouth daily.   benzonatate 100 MG capsule Commonly known as: TESSALON Take 1 capsule (100 mg total) by mouth every 8 (eight) hours.   citalopram 10 MG tablet Commonly known as: CELEXA Take 1 tablet (10 mg total) by mouth daily.   CVS B12 GUMMIES PO Take by mouth.   fluticasone 50 MCG/ACT nasal spray Commonly known as: FLONASE Place 1 spray into both nostrils in the morning and at bedtime.   guaiFENesin 600 MG 12 hr tablet Commonly known as: Mucinex Take 1 tablet (600 mg total) by mouth 2 (two) times daily as needed for cough or to loosen phlegm.   hydrocortisone cream 1 % Apply 1 application. topically.   levothyroxine 88 MCG tablet Commonly known as: SYNTHROID Take 1 tablet (88 mcg total) by mouth daily before breakfast.   loperamide 2 MG tablet Commonly known as: Imodium A-D Take 1 tablet (2 mg total) by mouth 4 (four) times daily as needed for diarrhea or loose stools.   methocarbamol 500 MG  tablet Commonly known as: ROBAXIN Take 1 tablet (500 mg total) by mouth 2 (two) times daily as needed for muscle spasms.   multivitamin tablet Take 1 tablet by mouth daily.   Vitamin D (Ergocalciferol) 1.25 MG (50000 UNIT) Caps capsule Commonly known as: DRISDOL Take 1 capsule (50,000 Units total) by mouth every 7 (seven) days.          REVIEW OF SYSTEMS: A comprehensive ROS was conducted with the patient and is negative except as per HPI     OBJECTIVE:  VS: BP (!) 160/100 (BP Location: Left Arm, Patient Position: Sitting, Cuff Size: Normal)   Pulse 94   Ht 5\' 2"  (1.575 m)   Wt 140 lb 12.8 oz (63.9 kg)   SpO2 95%   BMI 25.75 kg/m    Wt Readings from Last 3 Encounters:  09/07/22 140  lb 12.8 oz (63.9 kg)  07/15/22 141 lb (64 kg)  04/20/22 140 lb (63.5 kg)     EXAM: General: Pt appears well and is in NAD  Eyes: External eye exam normal with a stare, lid lag or exophthalmos.  EOM intact.  PERRL.  Neck: General: Supple without adenopathy. Thyroid: Thyroid size normal.  No goiter or nodules appreciated.  Lungs: Clear with good BS bilat with no rales, rhonchi, or wheezes  Heart: Auscultation: RRR.  Abdomen: Normoactive bowel sounds, soft, nontender, without masses or organomegaly palpable  Extremities:  BL LE: No pretibial edema normal ROM and strength.  Mental Status: Judgment, insight: Intact Orientation: Oriented to time, place, and person Mood and affect: No depression, anxiety, or agitation     DATA REVIEWED:   ***    Latest Reference Range & Units 08/25/21 16:18  Sodium 134 - 144 mmol/L 142  Potassium 3.5 - 5.2 mmol/L 4.3  Chloride 96 - 106 mmol/L 104  CO2 20 - 29 mmol/L 25  Glucose 70 - 99 mg/dL 88  BUN 6 - 24 mg/dL 11  Creatinine 0.57 - 1.00 mg/dL 0.74  Calcium 8.7 - 10.2 mg/dL 9.6  BUN/Creatinine Ratio 9 - 23  15  eGFR >59 mL/min/1.73 93  Alkaline Phosphatase 44 - 121 IU/L 71  Albumin 3.8 - 4.9 g/dL 4.4  Albumin/Globulin Ratio 1.2 - 2.2  1.7  AST 0 - 40 IU/L 19  ALT 0 - 32 IU/L 17  Total Protein 6.0 - 8.5 g/dL 7.0  Total Bilirubin 0.0 - 1.2 mg/dL 0.3     ASSESSMENT/PLAN/RECOMMENDATIONS:   Postablative hypothyroid  -She is status post RAI years ago for hyperthyroid.  - Pt is clinically euthyroid  - No local neck symptoms  -TFTs ***  Medications : Continue levothyroxine 88 mcg daily    Follow-up in 1 yr    Signed electronically by: Mack Guise, MD  [facility]  [facility] [address] [place], [place] [address] Phone: [phone] FAX: [phone]   CC: Ladell Pier, MD [address] [place] [address] Phone: [phone] Fax:  [phone]   Return to Endocrinology clinic as below: No future appointments.

## 2022-09-07 NOTE — Patient Instructions (Signed)

## 2022-09-08 LAB — TSH: TSH: 0.46 u[IU]/mL (ref 0.35–5.50)

## 2022-09-08 MED ORDER — LEVOTHYROXINE SODIUM 88 MCG PO TABS: 88.0000 ug | ORAL_TABLET | Freq: Every day | ORAL | 3 refills | Status: DC

## 2023-02-09 ENCOUNTER — Ambulatory Visit: Payer: Self-pay | Admitting: *Deleted

## 2023-02-09 ENCOUNTER — Telehealth: Payer: Self-pay | Admitting: Internal Medicine

## 2023-02-09 NOTE — Telephone Encounter (Signed)
Copied from CRM [id number]. Topic: Referral - Request for Referral >> Feb 09, 2023  1:34 PM [place] B wrote: Has patient seen PCP for this complaint? no *If NO, is insurance requiring patient see PCP for this issue before PCP can refer them? Referral for which specialty: mammogram Preferred provider/office: ? Reason for referral: pain in right breast at night time

## 2023-02-09 NOTE — Telephone Encounter (Signed)
Reason for Disposition  [1] Breast pain AND [2] cause is not known  Answer Assessment - Initial Assessment Questions 1. SYMPTOM: "What's the main symptom you're concerned about?"  (e.g., lump, pain, rash, nipple discharge)     Having pain in right breast.   For 2 months or so.    2. LOCATION: "Where is the pain located?"     breast 3. ONSET: "When did pain  start?"     2 months 4. PRIOR HISTORY: "Do you have any history of prior problems with your breasts?" (e.g., lumps, cancer, fibrocystic breast disease)     No 5. CAUSE: "What do you think is causing this symptom?"     Don't know 6. OTHER SYMPTOMS: "Do you have any other symptoms?" (e.g., fever, breast pain, redness or rash, nipple discharge)     No discharge, no redness.  It hurts when I squeeze my breast.    I've never had mammogram.    7. PREGNANCY-BREASTFEEDING: "Is there any chance you are pregnant?" "When was your last menstrual period?" "Are you breastfeeding?"     N/A due to age  Protocols used: Breast Symptoms-A-AH

## 2023-02-09 NOTE — Telephone Encounter (Signed)
Called received from agent patient c/o allergic reaction on hands and mouth. Call disconnected when transferred from agent. Called patient back to review sx  [phone] no answer. LVMTCB [phone]. Reason for Disposition  Message left on unidentified voice mail.  Phone number verified.  Answer Assessment - Initial Assessment Questions N/A No contact. Patient called in to report possible allergic reaction on hands and mouth. Call was disconnected and NT unable to speak with patient.  Protocols used: No Contact or Duplicate Contact Call-A-AH

## 2023-02-09 NOTE — Telephone Encounter (Signed)
Call placed to patient unable to reach message left on VM. To follow-up with S/S

## 2023-02-09 NOTE — Telephone Encounter (Signed)
  Chief Complaint: Right breast pain Symptoms: above Frequency: For 2 months  Pertinent Negatives: Patient denies nipple discharge, injuries or lumps.  Disposition: [] ED /[] Urgent Care (no appt availability in office) / [x] Appointment(In office/virtual)/ []  [facility]/ [] Home Care/ [] Refused Recommended Disposition /[] [facility]/ []  Follow-up with PCP Additional Notes: Appt made with Dr. Laural Benes for 02/18/2023 at 3:10.

## 2023-02-10 NOTE — Telephone Encounter (Signed)
Called LVM to call back

## 2023-02-11 NOTE — Telephone Encounter (Signed)
Called LVM to call back

## 2023-02-14 NOTE — Telephone Encounter (Signed)
Called & LVM to call back. 3rd attempt. Unable to reach patient.

## 2023-02-18 ENCOUNTER — Encounter: Payer: Self-pay | Admitting: Internal Medicine

## 2023-02-18 ENCOUNTER — Ambulatory Visit: Payer: Commercial Managed Care - HMO | Attending: Internal Medicine | Admitting: Internal Medicine

## 2023-02-18 VITALS — BP 140/90 | Temp 98.7°F | Ht 62.0 in | Wt 138.0 lb

## 2023-02-18 DIAGNOSIS — F5102 Adjustment insomnia: Secondary | ICD-10-CM | POA: Diagnosis not present

## 2023-02-18 DIAGNOSIS — E782 Mixed hyperlipidemia: Secondary | ICD-10-CM

## 2023-02-18 DIAGNOSIS — Z23 Encounter for immunization: Secondary | ICD-10-CM | POA: Diagnosis not present

## 2023-02-18 DIAGNOSIS — N644 Mastodynia: Secondary | ICD-10-CM | POA: Diagnosis not present

## 2023-02-18 DIAGNOSIS — R030 Elevated blood-pressure reading, without diagnosis of hypertension: Secondary | ICD-10-CM

## 2023-02-18 MED ORDER — TRAZODONE HCL 50 MG PO TABS
25.0000 mg | ORAL_TABLET | Freq: Every evening | ORAL | 3 refills | Status: DC | PRN
Start: 2023-02-18 — End: 2023-10-27

## 2023-02-18 NOTE — Progress Notes (Signed)
Patient ID: Natasha Roberts, female    DOB: 09-28-1960  MRN: [id number]  CC: Breast Pain (Intermittent breast pain X3 mo/Experiencing insomnia - requesting lorazepam/Requesting mammogram referral. Yes to Tdap vax. Yes to shingles vax. )   Subjective: Natasha Roberts is a 62 y.o. female who presents for chronic ds management Her concerns today include:  Pt with hypothyroid, HL   [employer] Language interpreter used during this encounter. #[id number] Roberts  C/o RT breast pain x 3 mths. Intermittent - stings and sometimes throbs.  No palpable mass.  No fhx of breast CA.  C/o issues with insomnia x 2 mths Had same issue 2 yrs ago; a doctor prescribed Lorazepam 2 mg that was very helpful.  Would like to be prescribed lorazepam again. She thinks contributing to the insomnia is that she has been out of work for 3 months.  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  She is worried about finding another job and paying her bills.   Gets in bed b/w 9-10 p.m.  She turns off phone, TV and all lights.  Does not drink caffeinated beverages in the evenings. Can take 1-1.5 hr to fall asleep; wakes up at 1-2 a.m and has problems getting back to sleep.  Hypothyroid:  on Levothyroxine 88 mcg.  Followed by endocrinology.    HL:  was on Lipitor in past.  Currently not taking.  BP elev today.  No history of hypertension. Not using a lot of salt in the foods.  HM:  agrees to shingles and Tdapt vaccine today. 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 pylori infection 02/26/2021   Mixed hyperlipidemia 02/26/2021   Hypothyroidism 01/26/2021   Abdominal pain, epigastric 01/26/2021   Nausea without vomiting 01/26/2021   Elevated liver enzymes 01/26/2021     Current Outpatient Medications on File Prior to Visit   Medication Sig Dispense Refill   atorvastatin (LIPITOR) 10 MG tablet Take 1 tablet (10 mg total) by mouth daily. (Patient not taking: Reported on 02/18/2023) 90 tablet 3   levothyroxine (SYNTHROID) 88 MCG tablet Take 1 tablet (88 mcg total) by mouth daily before breakfast. (Patient not taking: Reported on 02/18/2023) 90 tablet 3   No current facility-administered medications on file prior to visit.    No Known Allergies  Social History   Socioeconomic History   Marital status: Single    Spouse name: Not on file   Number of children: Not on file   Years of education: Not on file   Highest education level: Not on file  Occupational History   Not on file  Tobacco Use   Smoking status: Every Day    Packs/day: .1    Types: Cigarettes    Start date: 04/16/1992   Smokeless tobacco: Never  Substance and Sexual Activity   Alcohol use: Yes    Alcohol/week: 25.0 standard drinks of alcohol    Types: 25 Standard drinks or equivalent per week   Drug use: Never   Sexual activity: Not on file  Other Topics Concern   Not on file  Social History Narrative   Not on file   Social Determinants of Health   Financial Resource Strain: Not on file  Food Insecurity: Not on file  Transportation Needs: Not on file  Physical Activity: Not on  file  Stress: Not on file  Social Connections: Not on file  Intimate Partner Violence: Not on file    No family history on file.  Past Surgical History:  Procedure Laterality Date   TOTAL ABDOMINAL HYSTERECTOMY      ROS: Review of Systems Negative except as stated above  PHYSICAL EXAM: BP (!) 140/90   Temp 98.7 F (37.1 C) (Oral)   Ht 5\' 2"  (1.575 m)   Wt 138 lb (62.6 kg)   SpO2 100%   BMI 25.24 kg/m   Physical Exam  General appearance - alert, well appearing, older Hispanic female and in no distress Mental status - normal mood, behavior, speech, dress, motor activity, and thought processes Neck - supple, no significant adenopathy Chest -  clear to auscultation, no wheezes, rales or rhonchi, symmetric air entry Heart - normal rate, regular rhythm, normal S1, S2, no murmurs, rubs, clicks or gallops Extremities - peripheral pulses normal, no pedal edema, no clubbing or cyanosis Breast: CMA Clarissa is present as a chaperone: No axillary lymphadenopathy.  Breasts appear symmetric.  No dimpling noted of the skin.  No palpable masses.    07/15/2022    4:23 PM 05/01/2021    2:58 PM 02/25/2021    4:25 PM  Depression screen PHQ 2/9  Decreased Interest 3 1 0  Down, Depressed, Hopeless 3 1 0  PHQ - 2 Score 6 2 0  Altered sleeping 3 1 0  Tired, decreased energy 3 1 0  Change in appetite 2 3 0  Feeling bad or failure about yourself  1 0 0  Trouble concentrating 0 0 1  Moving slowly or fidgety/restless 0 0 0  Suicidal thoughts 0 0 0  PHQ-9 Score 15 7 1        Latest Ref Rng & Units 08/25/2021    4:18 PM 01/27/2021    9:20 AM 01/03/2021   12:28 AM  CMP  Glucose 70 - 99 mg/dL 88  409  811   BUN 6 - 24 mg/dL 11  10  <5   Creatinine 0.57 - 1.00 mg/dL 9.14  7.82  9.56   Sodium 134 - 144 mmol/L 142  141  139   Potassium 3.5 - 5.2 mmol/L 4.3  4.2  3.7   Chloride 96 - 106 mmol/L 104  106  108   CO2 20 - 29 mmol/L 25   21   Calcium 8.7 - 10.2 mg/dL 9.6  9.3  8.1   Total Protein 6.0 - 8.5 g/dL 7.0  6.9  5.6   Total Bilirubin 0.0 - 1.2 mg/dL 0.3  0.3  0.7   Alkaline Phos 44 - 121 IU/L 71  93  211   AST 0 - 40 IU/L 19  18  111   ALT 0 - 32 IU/L 17   132    Lipid Panel     Component Value Date/Time   CHOL 176 08/25/2021 1618   TRIG 289 (H) 08/25/2021 1618   HDL 48 08/25/2021 1618   CHOLHDL 3.7 08/25/2021 1618   LDLCALC 81 08/25/2021 1618    CBC    Component Value Date/Time   WBC 7.9 08/25/2021 1618   WBC 6.1 01/03/2021 0028   RBC 4.44 08/25/2021 1618   RBC 4.43 01/03/2021 0028   HGB 13.9 08/25/2021 1618   HCT 41.7 08/25/2021 1618   PLT 261 08/25/2021 1618   MCV 94 08/25/2021 1618   MCH 31.3 08/25/2021 1618   MCH 31.8  01/03/2021 0028  MCHC 33.3 08/25/2021 1618   MCHC 34.3 01/03/2021 0028   RDW 13.6 08/25/2021 1618   LYMPHSABS 4.0 (H) 08/25/2021 1618   MONOABS 0.1 12/31/2020 2009   EOSABS 0.1 08/25/2021 1618   BASOSABS 0.1 08/25/2021 1618    ASSESSMENT AND PLAN: 1. Breast pain, right - MM Digital Diagnostic Unilat R; Future  2. Adjustment insomnia Discussed good sleep hygiene including getting in bed around about the same time every night, turning off all lights and sounds once in bed and getting up and do something like read if unable to fall asleep within 40 minutes of getting in bed.  Advised to avoid drinking caffeinated beverages or excessive alcohol at bedtime. -I declined prescribing lorazepam as it can become habit-forming.  I recommend trial of trazodone.  Patient is agreeable to this. - traZODone (DESYREL) 50 MG tablet; Take 0.5-1 tablets (25-50 mg total) by mouth at bedtime as needed for sleep.  Dispense: 30 tablet; Refill: 3  3. Need for shingles vaccine First Shingrix vaccine given today.  Advised that it can cause some soreness and redness at the injection site.  4. Need for Tdap vaccination Given today.  5. Elevated blood pressure reading in office without diagnosis of hypertension DASH diet discussed and encouraged.  6. Mixed hyperlipidemia - CBC - Comprehensive metabolic panel - Lipid panel     Patient was given the opportunity to ask questions.  Patient verbalized understanding of the plan and was able to repeat key elements of the plan.   This documentation was completed using Paediatric nurse.  Any transcriptional errors are unintentional.  Orders Placed This Encounter  Procedures   MM Digital Diagnostic Unilat R   Tdap vaccine greater than or equal to 7yo IM   Varicella-zoster vaccine IM   CBC   Comprehensive metabolic panel   Lipid panel     Requested Prescriptions   Signed Prescriptions Disp Refills   traZODone (DESYREL) 50 MG tablet  30 tablet 3    Sig: Take 0.5-1 tablets (25-50 mg total) by mouth at bedtime as needed for sleep.    Return in about 4 months (around 06/21/2023) for Appt with [facility] in 4 wks for BP check.  Jonah Blue, MD, FACP

## 2023-02-19 ENCOUNTER — Other Ambulatory Visit: Payer: Self-pay | Admitting: Internal Medicine

## 2023-02-19 DIAGNOSIS — R7989 Other specified abnormal findings of blood chemistry: Secondary | ICD-10-CM

## 2023-02-19 LAB — LIPID PANEL
Chol/HDL Ratio: 5.1 ratio — ABNORMAL HIGH (ref 0.0–4.4)
Cholesterol, Total: 242 mg/dL — ABNORMAL HIGH (ref 100–199)
HDL: 47 mg/dL (ref >39)
LDL Chol Calc (NIH): 124 mg/dL — ABNORMAL HIGH (ref 0–99)
Triglycerides: 403 mg/dL — ABNORMAL HIGH (ref 0–149)
VLDL Cholesterol Cal: 71 mg/dL — ABNORMAL HIGH (ref 5–40)

## 2023-02-19 LAB — COMPREHENSIVE METABOLIC PANEL
ALT: 58 IU/L — ABNORMAL HIGH (ref 0–32)
AST: 53 IU/L — ABNORMAL HIGH (ref 0–40)
Albumin: 4.6 g/dL (ref 3.8–4.9)
Alkaline Phosphatase: 89 IU/L (ref 44–121)
BUN/Creatinine Ratio: 16 (ref 12–28)
BUN: 13 mg/dL (ref 8–27)
Bilirubin Total: 0.4 mg/dL (ref 0.0–1.2)
CO2: 23 mmol/L (ref 20–29)
Calcium: 9.9 mg/dL (ref 8.7–10.3)
Chloride: 102 mmol/L (ref 96–106)
Creatinine, Ser: 0.81 mg/dL (ref 0.57–1.00)
Globulin, Total: 2.6 g/dL (ref 1.5–4.5)
Glucose: 92 mg/dL (ref 70–99)
Potassium: 4 mmol/L (ref 3.5–5.2)
Sodium: 141 mmol/L (ref 134–144)
Total Protein: 7.2 g/dL (ref 6.0–8.5)
eGFR: 83 mL/min/{1.73_m2} (ref >59)

## 2023-02-19 LAB — CBC
Hematocrit: 46.5 % (ref 34.0–46.6)
Hemoglobin: 15.8 g/dL (ref 11.1–15.9)
MCH: 32.9 pg (ref 26.6–33.0)
MCHC: 34 g/dL (ref 31.5–35.7)
MCV: 97 fL (ref 79–97)
Platelets: 267 10*3/uL (ref 150–450)
RBC: 4.8 x10E6/uL (ref 3.77–5.28)
RDW: 13.9 % (ref 11.7–15.4)
WBC: 7.3 10*3/uL (ref 3.4–10.8)

## 2023-02-25 ENCOUNTER — Ambulatory Visit: Payer: Commercial Managed Care - HMO | Attending: Internal Medicine

## 2023-02-25 DIAGNOSIS — R7989 Other specified abnormal findings of blood chemistry: Secondary | ICD-10-CM

## 2023-02-26 LAB — HEPATITIS B SURFACE ANTIBODY, QUANTITATIVE: Hepatitis B Surf Ab Quant: <3.5 m[IU]/mL — ABNORMAL LOW

## 2023-02-26 LAB — HEPATITIS B SURFACE ANTIGEN: Hepatitis B Surface Ag: NEGATIVE

## 2023-02-26 LAB — HEPATITIS C ANTIBODY: Hep C Virus Ab: NONREACTIVE

## 2023-02-26 NOTE — Progress Notes (Signed)
Tested negative for hepatitis C and B.  Not adequately immunized against hepatitis B virus.  Would recommend getting the vaccine series.  Gets 1st shot then 2nd shot a month later then 3rd shot 6 mths later.  Give appt with clinical pharmacist if she would like to get started with receive vaccine series.

## 2023-03-03 ENCOUNTER — Telehealth: Payer: Self-pay

## 2023-03-03 NOTE — Telephone Encounter (Signed)
Pt returned our call for lab results. Pt has appt scheduled to see Franky Macho on July 23. She would like to start the Hep B series at that time if possible.

## 2023-03-03 NOTE — Telephone Encounter (Signed)
Noted. Patient add to nurse schedule on 03/08/2023 at 3:00 pm  for HEP B vaccine.

## 2023-03-08 ENCOUNTER — Encounter: Payer: Self-pay | Admitting: Pharmacist

## 2023-03-08 ENCOUNTER — Ambulatory Visit: Payer: Commercial Managed Care - HMO | Attending: Internal Medicine | Admitting: Pharmacist

## 2023-03-08 ENCOUNTER — Ambulatory Visit: Payer: Commercial Managed Care - HMO

## 2023-03-08 VITALS — BP 113/77 | HR 106

## 2023-03-08 DIAGNOSIS — Z23 Encounter for immunization: Secondary | ICD-10-CM | POA: Diagnosis not present

## 2023-03-08 DIAGNOSIS — R030 Elevated blood-pressure reading, without diagnosis of hypertension: Secondary | ICD-10-CM

## 2023-03-08 NOTE — Progress Notes (Signed)
   S:     No chief complaint on file.  62 y.o. female who presents for hypertension evaluation, education, and management.  PMH is significant for elevated BP w/o the dx of HTN, hypothyroidism, transaminitis, HLD.  Patient was referred and last seen by Primary Care Provider, Dr. Laural Benes, on 02/18/23.   At last visit, BP was 140/90 mmHg. Her BP seems to vary in clinic from normotensive to readings that would satisfy stage II HTN classification. However, she has not been formally dx'd with HTN.   Today, patient arrives in good spirits and presents without assistance. Denies dizziness, headache, blurred vision, swelling.   Family/Social history:  -Fhx: no pertinent positives -Tobacco: current every day smoker -Alcohol: none reported   Medication adherence: does not take antihypertensives currently.   Current antihypertensives include: none  Antihypertensives tried in the past include: none  Reported home BP readings: none  Patient reported dietary habits:  -Compliant with sodium restriction -Denies excessive caffeine use   Patient-reported exercise habits: none   O:  Vitals:   03/08/23 1718  BP: 113/77  Pulse: (!) 106     Last 3 Office BP readings: BP Readings from Last 3 Encounters:  03/08/23 113/77  02/18/23 (!) 140/90  09/07/22 120/80    BMET    Component Value Date/Time   NA 141 02/18/2023 1639   K 4.0 02/18/2023 1639   CL 102 02/18/2023 1639   CO2 23 02/18/2023 1639   GLUCOSE 92 02/18/2023 1639   GLUCOSE 104 (H) 01/03/2021 0028   BUN 13 02/18/2023 1639   CREATININE 0.81 02/18/2023 1639   CALCIUM 9.9 02/18/2023 1639   GFRNONAA >60 01/03/2021 0028    Renal function: CrCl cannot be calculated (Unknown ideal weight.).  Clinical ASCVD: No  The 10-year ASCVD risk score (Arnett DK, et al., 2019) is: 7%   Values used to calculate the score:     Age: 70 years     Sex: Female     Is Non-Hispanic African American: No     Diabetic: No     Tobacco smoker:  Yes     Systolic Blood Pressure: 113 mmHg     Is BP treated: No     HDL Cholesterol: 47 mg/dL     Total Cholesterol: 242 mg/dL  Patient is participating in a Managed Medicaid Plan: no    A/P: Hypertension undiagnosed. BP is normotensive today. No medication at this time.  -F/u labs ordered - none -Counseled on lifestyle modifications for blood pressure control including reduced dietary sodium, increased exercise, adequate sleep. -Encouraged patient to check BP at home and bring log of readings to next visit. Counseled on proper use of home BP cuff.  HM: given 1 of 3 doses of HBV vaccine. Return in 1 month for 2nd dose.   Results reviewed and written information provided.    Written patient instructions provided. Patient verbalized understanding of treatment plan.  Total time in face to face counseling 30 minutes.    Follow-up:  Pharmacist in 1 month for second HBV vaccine.  Butch Penny, PharmD, Patsy [place], CPP Clinical Pharmacist [facility] [phone]

## 2023-03-09 ENCOUNTER — Other Ambulatory Visit: Payer: Self-pay | Admitting: Internal Medicine

## 2023-03-09 DIAGNOSIS — L309 Dermatitis, unspecified: Secondary | ICD-10-CM

## 2023-03-18 ENCOUNTER — Ambulatory Visit: Payer: Commercial Managed Care - HMO | Admitting: Pharmacist

## 2023-04-14 ENCOUNTER — Ambulatory Visit: Payer: Commercial Managed Care - HMO | Admitting: Pharmacist

## 2023-04-14 NOTE — Progress Notes (Unsigned)
S:     No chief complaint on file.  62 y.o. female who presents for hypertension evaluation, education, and management.  PMH is significant for elevated BP w/out HTN dx, HLD, hypothyroidism, transaminitis.  Patient was referred and last seen by Primary Care Provider, Dr. Laural Benes, on 02/18/23. Patient was last seen by Pharmacy clinic on 03/08/23  At last visit, BP in office at goal 113/77. No medications were started. She is due for second dose of HBV vaccine.   Today, patient arrives in good spirits and presents without assistance. *** Denies dizziness, headache, blurred vision, swelling.   Patient reports hypertension was diagnosed in ***.   Family/Social history:  -Fhx: no pertinent positives -Tobacco: current every day smoker -Alcohol: none reported   Current antihypertensives include: none  Antihypertensives tried in the past include: none  Current lipid-lowering medications: atorvastatin 10 mg daily  Reported home BP readings: ***  Patient reported dietary habits: -Compliant with sodium restriction -Denies excessive caffeine use   Patient-reported exercise habits: ***  ASCVD risk factors include: ***  O:  ROS  Physical Exam  Last 3 Office BP readings: BP Readings from Last 3 Encounters:  03/08/23 113/77  02/18/23 (!) 140/90  09/07/22 120/80    BMET    Component Value Date/Time   NA 141 02/18/2023 1639   K 4.0 02/18/2023 1639   CL 102 02/18/2023 1639   CO2 23 02/18/2023 1639   GLUCOSE 92 02/18/2023 1639   GLUCOSE 104 (H) 01/03/2021 0028   BUN 13 02/18/2023 1639   CREATININE 0.81 02/18/2023 1639   CALCIUM 9.9 02/18/2023 1639   GFRNONAA >60 01/03/2021 0028   Lipid Panel     Component Value Date/Time   CHOL 242 (H) 02/18/2023 1639   TRIG 403 (H) 02/18/2023 1639   HDL 47 02/18/2023 1639   CHOLHDL 5.1 (H) 02/18/2023 1639   LDLCALC 124 (H) 02/18/2023 1639   LABVLDL 71 (H) 02/18/2023 1639     Renal function: CrCl cannot be calculated (Patient's  most recent lab result is older than the maximum 21 days allowed.).  Clinical ASCVD: No  The 10-year ASCVD risk score (Arnett DK, et al., 2019) is: 7%   Values used to calculate the score:     Age: 54 years     Sex: Female     Is Non-Hispanic African American: No     Diabetic: No     Tobacco smoker: Yes     Systolic Blood Pressure: 113 mmHg     Is BP treated: No     HDL Cholesterol: 47 mg/dL     Total Cholesterol: 242 mg/dL  Patient is participating in a Managed Medicaid Plan: no    A/P: Hypertension diagnosed *** currently *** on current medications. BP goal < 130/80 *** mmHg. Medication adherence appears ***. Control is suboptimal due to ***.  -{Meds adjust:18428} ***.  -{Meds adjust:18428} ***.  -Patient educated on purpose, proper use, and potential adverse effects of ***.  -F/u labs ordered - *** -Counseled on lifestyle modifications for blood pressure control including reduced dietary sodium, increased exercise, adequate sleep. -Encouraged patient to check BP at home and bring log of readings to next visit. Counseled on proper use of home BP cuff.   ASCVD risk - primary prevention in patient without comorbidities. Last LDL is 124 not at goal of <518 mg/dL. Moderate intensity statin indicated.  -Increased dose of atorvastatin to 20 mg.  Results reviewed and written information provided.    Written patient instructions  provided. Patient verbalized understanding of treatment plan.  Total time in face to face counseling *** minutes.    Follow-up:  Pharmacist ***. PCP clinic visit in 2 months (06/21/23).   Patient seen with Rosina Lowenstein, PharmD Candidate [facility]

## 2023-06-21 ENCOUNTER — Ambulatory Visit: Payer: Self-pay | Admitting: Internal Medicine

## 2023-09-09 ENCOUNTER — Encounter: Payer: Self-pay | Admitting: Internal Medicine

## 2023-09-09 ENCOUNTER — Ambulatory Visit: Payer: Commercial Managed Care - HMO | Admitting: Internal Medicine

## 2023-09-09 VITALS — BP 120/80 | HR 100 | Ht 62.0 in | Wt 135.0 lb

## 2023-09-09 DIAGNOSIS — R5383 Other fatigue: Secondary | ICD-10-CM | POA: Diagnosis not present

## 2023-09-09 DIAGNOSIS — E890 Postprocedural hypothyroidism: Secondary | ICD-10-CM

## 2023-09-09 DIAGNOSIS — G4709 Other insomnia: Secondary | ICD-10-CM

## 2023-09-09 NOTE — Patient Instructions (Addendum)
Est tomando levotiroxina, que es su suplemento de hormona tiroidea. DEBE tomarlo de manera constante.  Debe tomarlo a primera hora de la maana con el estmago vaco y con Sports coach. No debe tomarlo junto con otros medicamentos. Espere entre 30 minutos y 1 hora antes de Arts administrator. Si est tomando vitaminas, tmelas por la noche.  Si se olvida de tomar una dosis, tmela al da siguiente (duplique la dosis de Special educational needs teacher). Debe tener un pastillero SOLO para levotiroxina en su mesa de noche para recordar tomar sus medicamentos.    You are on levothyroxine - which is your thyroid hormone supplement. You MUST take this consistently.  You should take this first thing in the morning on an empty stomach with water. You should not take it with other medications. Wait to 1hr prior to eating. If you are taking any vitamins - please take these in the evening.   If you miss a dose, please take your missed dose the following day (double the dose for that day). You should have a pill box for ONLY levothyroxine on your bedside table to help you remember to take your medications.

## 2023-09-09 NOTE — Progress Notes (Unsigned)
Name: Natasha Roberts  MRN/ DOB: [id number], [birth year]    Age/ Sex: 63 y.o., female    PCP: Marcine Matar, MD   Reason for Endocrinology Evaluation: Hypothyroidism     Date of Initial Endocrinology Evaluation: 11/13/2021    HPI: Natasha Roberts is a 63 y.o. female with a past medical history of hypothyroid and dyslipidemia. The patient presented for initial endocrinology clinic visit on 11/13/2021 for consultative assistance with her Hypothyroidism.      She has been diagnosed with hyperthyroidism , she is S/P RAI ablation   16 years ago. She was started on Levothyroxine immediately after radiation    In 08/2021 was noted to have low TSH and levothyroxine decreased from 100 to 88 mcg , Two months prior to that her TSH was elevated at 26.7 uIU/mL due to lack of insurance .   No prior dx of cardiac arhythmia or osteoporosis   No Fh of thyroid disease     SUBJECTIVE:    Today (09/09/23):  Natasha Roberts is here for a follow up on hypothyroidism   Interpreter line :Elisa # [id number] No local neck swelling  Denies constipation or diarrhea  Continues with occasional palpitations  Has occasional tremors as well of the right hand Has noted insomnia with lack energy , insomnia is described as difficulty maintaining sleep . Melatonin gummies did not help   Levothyroxine 88 mcg daily    HISTORY:  Past Medical History:  Past Medical History:  Diagnosis Date   High cholesterol    Thyroid disease hypothyroidism   Past Surgical History:  Past Surgical History:  Procedure Laterality Date   TOTAL ABDOMINAL HYSTERECTOMY      Social History:  reports that she has been smoking cigarettes. She started smoking about 31 years ago. She has a 3.1 pack-year smoking history. She has never used smokeless tobacco. She reports current alcohol use of about 25.0 standard drinks of alcohol per week. She reports that she does not use drugs. Family History: family history is not on  file.   HOME MEDICATIONS: Allergies as of 09/09/2023   No Known Allergies      Medication List        Accurate as of September 09, 2023  3:03 PM. If you have any questions, ask your nurse or doctor.          atorvastatin 10 MG tablet Commonly known as: LIPITOR Take 1 tablet (10 mg total) by mouth daily.   levothyroxine 88 MCG tablet Commonly known as: SYNTHROID Take 1 tablet (88 mcg total) by mouth daily before breakfast.   traZODone 50 MG tablet Commonly known as: DESYREL Take 0.5-1 tablets (25-50 mg total) by mouth at bedtime as needed for sleep.          REVIEW OF SYSTEMS: A comprehensive ROS was conducted with the patient and is negative except as per HPI     OBJECTIVE:  VS: BP 120/80 (BP Location: Left Arm, Patient Position: Sitting, Cuff Size: Small)   Pulse 100   Ht 5\' 2"  (1.575 m)   Wt 135 lb (61.2 kg)   SpO2 98%   BMI 24.69 kg/m    Wt Readings from Last 3 Encounters:  09/09/23 135 lb (61.2 kg)  02/18/23 138 lb (62.6 kg)  09/07/22 140 lb 12.8 oz (63.9 kg)     EXAM: General: Pt appears well and is in NAD  Eyes: External eye exam normal with a stare, lid lag or exophthalmos.  EOM intact.  PERRL.  Neck: General: Supple without adenopathy. Thyroid:   No goiter or nodules appreciated.  Lungs: Clear with good BS bilat   Heart: Auscultation: RRR.  Abdomen: soft, nontender  Extremities:  BL LE: No pretibial edema   Mental Status: Judgment, insight: Intact Orientation: Oriented to time, place, and person Mood and affect: No depression, anxiety, or agitation     DATA REVIEWED:  ****     ASSESSMENT/PLAN/RECOMMENDATIONS:   Postablative hypothyroid  -She is status post RAI years ago for hyperthyroid.  - Pt is clinically euthyroid  - No local neck symptoms  -Tsh continues to be normal  Medications : Continue levothyroxine 88 mcg daily   2. Fatigue :  -Most likely due to insomnia -Vitamin B, CBC and vitamin D****    Follow-up in  1 yr    Signed electronically by: Lyndle Herrlich, MD  [facility] Endocrinology  [facility] [address] [place], [place] [address] Phone: [phone] FAX: [phone]   CC: Marcine Matar, MD [address] [place] [address] Phone: [phone] Fax: [phone]   Return to Endocrinology clinic as below: Future Appointments  Date Time Provider Department Center  09/12/2024  2:00 PM Francille Wittmann, Konrad Dolores, MD [id number] None

## 2023-09-10 LAB — CBC
HCT: 45.2 % — ABNORMAL HIGH (ref 35.0–45.0)
Hemoglobin: 15.1 g/dL (ref 11.7–15.5)
MCH: 32.5 pg (ref 27.0–33.0)
MCHC: 33.4 g/dL (ref 32.0–36.0)
MCV: 97.2 fL (ref 80.0–100.0)
MPV: 11.3 fL (ref 7.5–12.5)
Platelets: 284 10*3/uL (ref 140–400)
RBC: 4.65 10*6/uL (ref 3.80–5.10)
RDW: 13.6 % (ref 11.0–15.0)
WBC: 6.2 10*3/uL (ref 3.8–10.8)

## 2023-09-10 LAB — TSH: TSH: 0.05 m[IU]/L — ABNORMAL LOW (ref 0.40–4.50)

## 2023-09-10 LAB — VITAMIN D 25 HYDROXY (VIT D DEFICIENCY, FRACTURES): Vit D, 25-Hydroxy: 53 ng/mL (ref 30–100)

## 2023-09-10 LAB — T4, FREE: Free T4: 1.7 ng/dL (ref 0.8–1.8)

## 2023-09-12 ENCOUNTER — Telehealth: Payer: Self-pay | Admitting: Internal Medicine

## 2023-09-12 MED ORDER — LEVOTHYROXINE SODIUM 75 MCG PO TABS: 75.0000 ug | ORAL_TABLET | Freq: Every day | ORAL | 3 refills | Status: DC

## 2023-09-12 NOTE — Telephone Encounter (Signed)
Please contact the patient and let her know that she is on too much thyroid hormone medication, this is why she has been more tired than usual.    Please asked the patient to discontinue levothyroxine 88 and start levothyroxine 75 mcg daily.  Please schedule the patient for repeat labs in 2 months   Vitamin D and hemoglobin are normal

## 2023-09-13 NOTE — Telephone Encounter (Signed)
Patient advised with interpreter services and verbalized understanding. Patient lab appt scheduled

## 2023-10-27 ENCOUNTER — Ambulatory Visit (HOSPITAL_COMMUNITY)
Admission: EM | Admit: 2023-10-27 | Discharge: 2023-10-27 | Disposition: A | Discharge status: Home / Self Care | Attending: Family Medicine | Admitting: Family Medicine

## 2023-10-27 ENCOUNTER — Encounter (HOSPITAL_COMMUNITY): Payer: Self-pay

## 2023-10-27 ENCOUNTER — Emergency Department (HOSPITAL_COMMUNITY)
Admission: EM | Admit: 2023-10-27 | Discharge: 2023-10-27 | Disposition: A | Discharge status: Home / Self Care | Attending: Emergency Medicine | Admitting: Emergency Medicine

## 2023-10-27 ENCOUNTER — Other Ambulatory Visit: Payer: Self-pay

## 2023-10-27 ENCOUNTER — Emergency Department (HOSPITAL_COMMUNITY)

## 2023-10-27 ENCOUNTER — Ambulatory Visit: Payer: Self-pay | Admitting: Internal Medicine

## 2023-10-27 DIAGNOSIS — R002 Palpitations: Secondary | ICD-10-CM | POA: Insufficient documentation

## 2023-10-27 DIAGNOSIS — R072 Precordial pain: Secondary | ICD-10-CM

## 2023-10-27 DIAGNOSIS — E039 Hypothyroidism, unspecified: Secondary | ICD-10-CM | POA: Diagnosis not present

## 2023-10-27 DIAGNOSIS — R9431 Abnormal electrocardiogram [ECG] [EKG]: Secondary | ICD-10-CM

## 2023-10-27 DIAGNOSIS — R519 Headache, unspecified: Secondary | ICD-10-CM | POA: Insufficient documentation

## 2023-10-27 DIAGNOSIS — R42 Dizziness and giddiness: Secondary | ICD-10-CM | POA: Insufficient documentation

## 2023-10-27 DIAGNOSIS — Z79899 Other long term (current) drug therapy: Secondary | ICD-10-CM | POA: Insufficient documentation

## 2023-10-27 DIAGNOSIS — R3121 Asymptomatic microscopic hematuria: Secondary | ICD-10-CM

## 2023-10-27 LAB — URINALYSIS, ROUTINE W REFLEX MICROSCOPIC
Bilirubin Urine: NEGATIVE
Glucose, UA: NEGATIVE mg/dL
Ketones, ur: NEGATIVE mg/dL
Leukocytes,Ua: NEGATIVE
Nitrite: NEGATIVE
Protein, ur: NEGATIVE mg/dL
Specific Gravity, Urine: 1.006 (ref 1.005–1.030)
pH: 6 (ref 5.0–8.0)

## 2023-10-27 LAB — RESP PANEL BY RT-PCR (RSV, FLU A&B, COVID)  RVPGX2
Influenza A by PCR: NEGATIVE
Influenza B by PCR: NEGATIVE
Resp Syncytial Virus by PCR: NEGATIVE
SARS Coronavirus 2 by RT PCR: NEGATIVE

## 2023-10-27 LAB — CBC
HCT: 44.6 % (ref 36.0–46.0)
Hemoglobin: 15.3 g/dL — ABNORMAL HIGH (ref 12.0–15.0)
MCH: 33.2 pg (ref 26.0–34.0)
MCHC: 34.3 g/dL (ref 30.0–36.0)
MCV: 96.7 fL (ref 80.0–100.0)
Platelets: 268 10*3/uL (ref 150–400)
RBC: 4.61 MIL/uL (ref 3.87–5.11)
RDW: 12.5 % (ref 11.5–15.5)
WBC: 7.3 10*3/uL (ref 4.0–10.5)
nRBC: 0 % (ref 0.0–0.2)

## 2023-10-27 LAB — CBG MONITORING, ED: Glucose-Capillary: 92 mg/dL (ref 70–99)

## 2023-10-27 LAB — BASIC METABOLIC PANEL
Anion gap: 14 (ref 5–15)
BUN: 8 mg/dL (ref 8–23)
CO2: 21 mmol/L — ABNORMAL LOW (ref 22–32)
Calcium: 9.4 mg/dL (ref 8.9–10.3)
Chloride: 104 mmol/L (ref 98–111)
Creatinine, Ser: 0.78 mg/dL (ref 0.44–1.00)
GFR, Estimated: >60 mL/min (ref >60)
Glucose, Bld: 92 mg/dL (ref 70–99)
Potassium: 3.7 mmol/L (ref 3.5–5.1)
Sodium: 139 mmol/L (ref 135–145)

## 2023-10-27 LAB — TROPONIN I (HIGH SENSITIVITY)
Troponin I (High Sensitivity): 4 ng/L (ref <18)
Troponin I (High Sensitivity): <2 ng/L (ref <18)

## 2023-10-27 LAB — TSH: TSH: 0.279 u[IU]/mL — ABNORMAL LOW (ref 0.350–4.500)

## 2023-10-27 NOTE — ED Triage Notes (Signed)
 Patient states having heart palpitations, dizziness and sweating onset 7 days ago. Denies history of heart problems, sick symptoms, or sick exposure. Patient states she has felt faint. Having headache, nausea, but no emesis.

## 2023-10-27 NOTE — ED Triage Notes (Addendum)
 Pt/translator stated, 7 days ago I started having palpitations, dizziness, sour mouth, headache and sweats. It has happened before. At that time they said my cholesterol was high. I called my Dr. And they said to come here. Last episode was 7 months ago.

## 2023-10-27 NOTE — ED Provider Notes (Signed)
 [facility] AT [facility] Provider Note   CSN: [id number] Arrival date & time: 10/27/23  1201     History  Chief Complaint  Patient presents with   Headache   Palpitations   Dizziness   sour mouth    Natasha Roberts is a 63 y.o. female.  63 year old female here today for 7 days of feeling as though her heart was beating strongly, overall feeling weak, intermittent lightheadedness.  Patient went to an urgent care this morning, they were concerned about her EKG and sent her here for evaluation.  Patient denies any fever, chills, cough or congestion.  She denies any abdominal pain or dysuria.  She does not have a cardiac history.  Patient endorses a history of hypothyroidism, has been compliant with her medications.   Headache Associated symptoms: dizziness   Palpitations Associated symptoms: dizziness   Dizziness Associated symptoms: headaches and palpitations        Home Medications Prior to Admission medications   Medication Sig Start Date End Date Taking? Authorizing Provider  atorvastatin (LIPITOR) 10 MG tablet Take 1 tablet (10 mg total) by mouth daily. Patient not taking: Reported on 09/09/2023 02/11/21   Mayers, Kasandra Knudsen, PA-C  levothyroxine (SYNTHROID) 75 MCG tablet Take 1 tablet (75 mcg total) by mouth daily. 09/12/23   Shamleffer, Konrad Dolores, MD  traZODone (DESYREL) 50 MG tablet Take 0.5-1 tablets (25-50 mg total) by mouth at bedtime as needed for sleep. 02/18/23   Marcine Matar, MD      Allergies    Patient has no known allergies.    Review of Systems   Review of Systems  Cardiovascular:  Positive for palpitations.  Neurological:  Positive for dizziness and headaches.    Physical Exam Updated Vital Signs BP (!) 149/93   Pulse 73   Temp 98.1 F (36.7 C) (Oral)   Resp 18   SpO2 99%  Physical Exam Vitals reviewed.  HENT:     Head: Normocephalic.  Cardiovascular:     Rate and Rhythm: Normal rate.     Heart  sounds: Normal heart sounds.  Pulmonary:     Effort: Pulmonary effort is normal.  Abdominal:     Palpations: Abdomen is soft.  Musculoskeletal:        General: Normal range of motion.  Neurological:     Mental Status: She is alert.     Cranial Nerves: No cranial nerve deficit.     Gait: Gait normal.     ED Results / Procedures / Treatments   Labs (all labs ordered are listed, but only abnormal results are displayed) Labs Reviewed  BASIC METABOLIC PANEL - Abnormal; Notable for the following components:      Result Value   CO2 21 (*)    All other components within normal limits  CBC - Abnormal; Notable for the following components:   Hemoglobin 15.3 (*)    All other components within normal limits  RESP PANEL BY RT-PCR (RSV, FLU A&B, COVID)  RVPGX2  URINALYSIS, ROUTINE W REFLEX MICROSCOPIC  TSH  CBG MONITORING, ED  TROPONIN I (HIGH SENSITIVITY)  TROPONIN I (HIGH SENSITIVITY)    EKG EKG Interpretation Date/Time:  Thursday October 27 2023 12:20:58 EDT Ventricular Rate:  75 PR Interval:  146 QRS Duration:  134 QT Interval:  418 QTC Calculation: 466 R Axis:   89  Text Interpretation: Normal sinus rhythm Right bundle branch block Abnormal ECG When compared with ECG of 27-Oct-2023 11:40, PREVIOUS ECG IS PRESENT  Confirmed by Anders Simmonds [phone]) on 10/27/2023 1:28:23 PM  Radiology No results found.  Procedures Procedures    Medications Ordered in ED Medications - No data to display  ED Course/ Medical Decision Making/ A&P                                 Medical Decision Making 63 year old female here today for multiple complaints.  Plan -reviewed the patient's urgent care note.  I independently reviewed the patient's EKG.  I do not see any significant EKG changes from prior EKG from 2022.  Will check basic blood work on the patient, and delta troponin.  EKG ordered.  Overall, on exam patient looks well.  She has normal vital signs.  My independent review the  patient's chest x-ray shows no pneumonia.  Patient will be signed out to Dr. Fredderick Phenix pending blood work, delta troponin  Amount and/or Complexity of Data Reviewed Labs: ordered.           Final Clinical Impression(s) / ED Diagnoses Final diagnoses:  Palpitations    Rx / DC Orders ED Discharge Orders     None         [name], DO 10/27/23 1425

## 2023-10-27 NOTE — ED Notes (Signed)
 Patient is being discharged from the Urgent Care and sent to the Emergency Department via personal opperated vehicle . Per Dr Webb Silversmith, patient is in need of higher level of care due to palpitations and abnormal EKG. Patient is aware and verbalizes understanding of plan of care.  Vitals:   10/27/23 1132  BP: (!) 159/101  Pulse: 80  Resp: 18  Temp: 98.1 F (36.7 C)  SpO2: 99%

## 2023-10-27 NOTE — ED Provider Triage Note (Signed)
 Emergency Medicine Provider Triage Evaluation Note  Natasha Roberts , a 63 y.o. female  was evaluated in triage.  Pt complains of palpitations and generalized weakness x7 days as well as headache and sour taste in her mouth. Was seen by her PCP and advised to come here due to subtle changes to her EKG earlier today.  Review of Systems  Positive: Palpitations, shortness of breath Negative: Fevers   Physical Exam  BP (!) 145/90   Pulse 76   Temp 98.1 F (36.7 C) (Oral)   Resp 17   SpO2 98%  Gen:   Awake, no distress   Resp:  Normal effort  MSK:   Moves extremities without difficulty  Other:    Medical Decision Making  Medically screening exam initiated at 12:42 PM.  Appropriate orders placed.  Natasha Roberts was informed that the remainder of the evaluation will be completed by another provider, this initial triage assessment does not replace that evaluation, and the importance of remaining in the ED until their evaluation is complete.    Maxwell Marion, PA-C 10/27/23 1244

## 2023-10-27 NOTE — Telephone Encounter (Signed)
  Chief Complaint: Fast heart rate Symptoms: fast heart rate, nausea, sweating, dizziness Frequency: 7 days Pertinent Negatives: Patient denies CP, SOB Disposition: [x] ED /[] Urgent Care (no appt availability in office) / [] Appointment(In office/virtual)/ []  [facility]/ [] Home Care/ [] Refused Recommended Disposition /[] [facility]/ []  Follow-up with PCP Additional Notes: Patient calls using interpreter [facility] reporting she feels her heart beat is too fast x7 days. Reports sweating, dizziness, nausea as well. States it comes and goes, but is occurring at this time. During call patient reports this also occurred three months ago and then one month ago with a LOC- states she was not evaluated after. Per protocol, patient to present to ED now for evaluation based off symptoms. Patient alone at home, offered to call EMS and patient declined. Patient states she has someone to drive her to the emergency room. Care advice reviewed, patient verbalized understanding and denies further questions at this time.  Alerting PCP for review.    Copied from CRM [id number]. Topic: Clinical - Red Word Triage >> Oct 27, 2023 10:37 AM Elle L wrote: Red Word that prompted transfer to Nurse Triage: The patient states that she feels that her heart rate is high, her chest is pounding, and she feels dizzy. Reason for Disposition  Dizziness, lightheadedness, or weakness  Answer Assessment - Initial Assessment Questions 1. DESCRIPTION: "Please describe your heart rate or heartbeat that you are having" (e.g., fast/slow, regular/irregular, skipped or extra beats, "palpitations")     Feels it is beating faster than normal 2. ONSET: "When did it start?" (Minutes, hours or days)      7 days 3. DURATION: "How long does it last" (e.g., seconds, minutes, hours)     1 minute or so 4. PATTERN "Does it come and go, or has it been constant since it started?"  "Does it get worse with exertion?"   "Are you  feeling it now?"     Comes and goes, feeling it now, activity has no effect 5. TAP: "Using your hand, can you tap out what you are feeling on a chair or table in front of you, so that I can hear?" (Note: not all patients can do this)       Unable to complete 6. HEART RATE: "Can you tell me your heart rate?" "How many beats in 15 seconds?"  (Note: not all patients can do this)       Unable to do 7. RECURRENT SYMPTOM: "Have you ever had this before?" If Yes, ask: "When was the last time?" and "What happened that time?"      Yes, saw the thyroid doctor and PCP, reports high cholesterol and triglycerides- reports three months ago 8. CAUSE: "What do you think is causing the palpitations?"     Unsure 9. CARDIAC HISTORY: "Do you have any history of heart disease?" (e.g., heart attack, angina, bypass surgery, angioplasty, arrhythmia)      One month ago, LOC, but did not go to the ED because it passed fast. 10. OTHER SYMPTOMS: "Do you have any other symptoms?" (e.g., dizziness, chest pain, sweating, difficulty breathing)       Dizziness, sweating, heart beating too fast, nausea  Protocols used: Heart Rate and Heartbeat Questions-A-AH

## 2023-10-27 NOTE — ED Provider Notes (Signed)
 Care was taken over from Dr. Andria Meuse.  Patient presented with some palpitations and intermittent dizziness.  She is currently asymptomatic.  No chest pain.  Her troponins been negative.  No arrhythmias noted.  Other labs are nonconcerning.  She did have a CT scan of her abdomen pelvis ordered by Dr. Andria Meuse for microscopic hematuria.  She has decided that she does not want to have this done.  She does not have any abdominal pain or concerns for a kidney stone clinically.  She will follow-up with her primary care doctor to have her urine rechecked to make sure that the hemoglobin clears.  She is currently asymptomatic.  He was discharged home in good condition.  Return precautions were given.   Rolan Bucco, MD 10/27/23 [phone]

## 2023-10-27 NOTE — ED Provider Notes (Signed)
  [facility]    CSN: [id number] Arrival date & time: 10/27/23  1127    The patient was seen and evaluated in triage.  Sitting in chair calmly but reported sharp, midsternal, stabbing chest pain, intermittent lightheadedness, and fatigue.  She denies being on any medications except for thyroid medication which she has taken every day without missing.  She has not had any syncope, speech difficulty, confusion, hallucinations, one-sided weakness, falls, shortness of breath, nausea vomiting, numbness or weakness.  On exam the patient has a slightly anxious affect.  She is alert and oriented x 3 with normal responses to questions.  Normal work of breathing and lung sounds are normal.  There is a 2-3+ systolic heart murmur heard loudest over the left lower sternal border.  Regular rate and rhythm.  Distal pulses are equal and strong bilaterally at the radius.  Pain is not reproducible to palpation over the sternum.  Review of preliminary EKGs shows good validity, regular rate, sinus rhythm with P waves apparent in lead II.  There is some questionable artifact versus sinus arrhythmia seen on the strip.  Right bundle blanch block is present and also present with review of past EKGs but some interval worsening in leads V3, no axis deviation, no hypertrophy, but some questionable ST changes in lead III with inverted T waves that are also questionably present in lead II.  The patient's subtle EKG changes and chest pain I advised her to go immediately to the emergency room for ACS workup.  The patient voiced understanding and agreed to go immediately over.  She was stable upon discharge and able to ambulate without any difficulty.   Ivor Messier, MD 10/27/23 [phone]

## 2023-11-03 ENCOUNTER — Other Ambulatory Visit: Payer: Self-pay

## 2023-11-03 DIAGNOSIS — R5383 Other fatigue: Secondary | ICD-10-CM

## 2023-11-11 ENCOUNTER — Other Ambulatory Visit: Payer: Commercial Managed Care - HMO

## 2023-12-30 ENCOUNTER — Ambulatory Visit (HOSPITAL_COMMUNITY): Admission: EM | Admit: 2023-12-30 | Discharge: 2023-12-30 | Disposition: A | Discharge status: Home / Self Care

## 2023-12-30 ENCOUNTER — Encounter (HOSPITAL_COMMUNITY): Payer: Self-pay | Admitting: Emergency Medicine

## 2023-12-30 DIAGNOSIS — R198 Other specified symptoms and signs involving the digestive system and abdomen: Secondary | ICD-10-CM

## 2023-12-30 DIAGNOSIS — R509 Fever, unspecified: Secondary | ICD-10-CM | POA: Diagnosis not present

## 2023-12-30 DIAGNOSIS — R109 Unspecified abdominal pain: Secondary | ICD-10-CM

## 2023-12-30 NOTE — ED Provider Notes (Signed)
 [facility]    CSN: [id number] Arrival date & time: 12/30/23  1740      History   Chief Complaint Chief Complaint  Patient presents with   Abdominal Pain    HPI Natasha Roberts is a 63 y.o. female.   Patient presents today with a 5-day history of lower abdominal pain.  She is Spanish-speaking and video interpreter was utilized during visit.  She reports that pain began on her right lower abdomen but then spread to involve the entirety of her lower abdomen.  Pain is rated 8 on a 0-10 pain scale, described as cramping periodic sharp pains, no alleviating factors identified.  She has tried Tylenol  without improvement.  She has felt feverish but no recorded temperature.  Reports associated nausea but denies any vomiting, diarrhea, melena, hematochezia.  Her last bowel movement was yesterday.  She does have a history of diverticulosis based on CT scan from 2022 but has never had diverticulitis.  She denies any recent medication or dietary changes.  She has had abdominal hysterectomy but denies additional abdominal surgery and still has her appendix.  She reports the pain is worsening prompting evaluation today.    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 pylori infection 02/26/2021   Mixed hyperlipidemia 02/26/2021   Hypothyroidism 01/26/2021   Abdominal pain, epigastric 01/26/2021   Nausea without vomiting 01/26/2021   Elevated liver enzymes 01/26/2021    Past Surgical History:  Procedure Laterality Date   TOTAL ABDOMINAL HYSTERECTOMY      OB History   No obstetric history on file.      Home Medications    Prior to Admission medications   Medication Sig Start Date  End Date Taking? Authorizing Provider  cholecalciferol (VITAMIN D3) 25 MCG (1000 UNIT) tablet Take 1,000 Units by mouth daily.    [provider]  levothyroxine  (SYNTHROID ) 75 MCG tablet Take 1 tablet (75 mcg total) by mouth daily. 09/12/23   Shamleffer, Ibtehal Jaralla, MD  Multiple Vitamin (MULTIVITAMIN) capsule Take 1 capsule by mouth daily.    [provider]    Family History History reviewed. No pertinent family history.  Social History Social History   Tobacco Use   Smoking status: Every Day    Current packs/day: 0.10    Average packs/day: 0.1 packs/day for 31.7 years (3.2 ttl pk-yrs)    Types: Cigarettes    Start date: 04/16/1992   Smokeless tobacco: Never  Substance Use Topics   Alcohol use: Yes    Alcohol/week: 25.0 standard drinks of alcohol    Types: 25 Standard drinks or equivalent per week   Drug use: Never     Allergies   Patient has no known allergies.   Review of Systems Review of Systems  Constitutional:  Positive for activity change and fever. Negative for appetite change and fatigue.  Respiratory:  Negative for cough and shortness of breath.   Cardiovascular:  Negative for chest pain.  Gastrointestinal:  Positive for abdominal pain and nausea. Negative for constipation, diarrhea and vomiting.  Genitourinary:  Negative for dysuria, frequency, hematuria, pelvic pain, urgency, vaginal bleeding, vaginal discharge and vaginal pain.     Physical Exam Triage Vital  Signs ED Triage Vitals  Encounter Vitals Group     BP 12/30/23 1807 113/74     Systolic BP Percentile --      Diastolic BP Percentile --      Pulse Rate 12/30/23 1807 80     Resp 12/30/23 1807 17     Temp 12/30/23 1807 98.6 F (37 C)     Temp Source 12/30/23 1807 Oral     SpO2 12/30/23 1807 97 %     Weight --      Height --      Head Circumference --      Peak Flow --      Pain Score 12/30/23 1803 8     Pain Loc --      Pain Education --      Exclude from Growth Chart  --    No data found.  Updated Vital Signs BP 113/74 (BP Location: Left Arm)   Pulse 80   Temp 98.6 F (37 C) (Oral)   Resp 17   SpO2 97%   Visual Acuity Right Eye Distance:   Left Eye Distance:   Bilateral Distance:    Right Eye Near:   Left Eye Near:    Bilateral Near:     Physical Exam Vitals reviewed.  Constitutional:      General: She is awake. She is not in acute distress.    Appearance: Normal appearance. She is well-developed. She is ill-appearing.     Comments: Very pleasant female appears stated age in no acute distress sitting comfortably in exam room  HENT:     Head: Normocephalic and atraumatic.  Cardiovascular:     Rate and Rhythm: Normal rate and regular rhythm.     Heart sounds: Normal heart sounds, S1 normal and S2 normal. No murmur heard. Pulmonary:     Effort: Pulmonary effort is normal.     Breath sounds: Normal breath sounds. No wheezing, rhonchi or rales.     Comments: Clear to auscultation bilaterally Abdominal:     General: Bowel sounds are normal.     Palpations: Abdomen is soft.     Tenderness: There is generalized abdominal tenderness and tenderness in the right lower quadrant and suprapubic area. There is guarding and rebound. There is no right CVA tenderness or left CVA tenderness. Positive signs include Rovsing's sign and McBurney's sign.     Comments: Tender palpation throughout abdomen with associated guarding and rebound tenderness in right lower quadrant.  Positive McBurney point tenderness and Rovsing sign.  Psychiatric:        Behavior: Behavior is cooperative.      UC Treatments / Results  Labs (all labs ordered are listed, but only abnormal results are displayed) Labs Reviewed - No data to display  EKG   Radiology No results found.  Procedures Procedures (including critical care time)  Medications Ordered in UC Medications - No data to display  Initial Impression / Assessment and Plan / UC Course  I have reviewed  the triage vital signs and the nursing notes.  Pertinent labs & imaging results that were available during my care of the patient were reviewed by me and considered in my medical decision making (see chart for details).     Patient is afebrile, nontoxic, nontachycardic.  She is ill-appearing and high risk abdominal exam concerning for acute abdomen.  We discussed that given her clinical presentation the safest thing to do is to go to the emergency room since we do not have  advanced imaging or stat labs in urgent care.  She was initially hesitant but we had a long conversation about the importance of further evaluation due to concern for surgical abdomen.  She ultimately did agree to go to the ER but declined transport.  She was stable at time of discharge and safe for private transport.  Final Clinical Impressions(s) / UC Diagnoses   Final diagnoses:  Abdominal guarding  Continuous severe abdominal pain  Fever, unspecified   Discharge Instructions   None    ED Prescriptions   None    PDMP not reviewed this encounter.   Budd Cargo, PA-C 12/30/23 1832

## 2023-12-30 NOTE — ED Triage Notes (Signed)
 Used interpretor  Pt having abd pains for 5 days. When first started felt like labor contractions. Pain was intermittent. Today patient reports pain is less but constant and feels feverish. Took Tylenol  For First 3 days had nausea, denies vomiting and felt like needed to have BM but didn't. LBM yesterday, that was normal

## 2023-12-30 NOTE — ED Notes (Signed)
 Patient is being discharged from the Urgent Care and sent to the Emergency Department via POV. Per Chauncy Coral, PA, patient is in need of higher level of care due to abdominal pain. Patient is aware and verbalizes understanding of plan of care.  Vitals:   12/30/23 1807  BP: 113/74  Pulse: 80  Resp: 17  Temp: 98.6 F (37 C)  SpO2: 97%

## 2024-01-25 ENCOUNTER — Encounter: Payer: Self-pay | Admitting: Nurse Practitioner

## 2024-01-25 ENCOUNTER — Ambulatory Visit: Attending: Nurse Practitioner | Admitting: Nurse Practitioner

## 2024-01-25 VITALS — BP 128/79 | HR 86 | Resp 18 | Ht 62.0 in | Wt 123.6 lb

## 2024-01-25 DIAGNOSIS — R1011 Right upper quadrant pain: Secondary | ICD-10-CM | POA: Diagnosis not present

## 2024-01-25 DIAGNOSIS — Z1211 Encounter for screening for malignant neoplasm of colon: Secondary | ICD-10-CM | POA: Diagnosis not present

## 2024-01-25 NOTE — Progress Notes (Signed)
 Assessment & Plan:  [name] was seen today for abdominal pain.  Diagnoses and all orders for this visit:  RUQ pain -     CBC with Differential -     Urinalysis, Complete -     CMP14+EGFR -     H. pylori breath test    Patient has been counseled on age-appropriate routine health concerns for screening and prevention. These are reviewed and up-to-date. Referrals have been placed accordingly. Immunizations are up-to-date or declined.    Subjective:   Chief Complaint  Patient presents with   Abdominal Pain    Right side    Natasha Roberts 63 y.o. female presents to office today with complaints of right sided abdominal pain.  She is a patient of Dr. Lincoln Renshaw   VRI was used to communicate directly with patient for the entire encounter including providing detailed patient instructions.    She has a past medical history of High cholesterol and Thyroid  disease    She was seen in the Urgent care 12-30-2023 with right lower abdominal pain radiating to back and was instructed to go to the emergency room for imaging and blood work due to history of diverticulosis.  Although she stated she would go she did not go to the ED. Associated symptoms at that time included nausea and diarrhea She took tetracycline for her pain and it has currently resolved. She has never had a colonoscopy. Referred to GI today.      Review of Systems  Constitutional:  Negative for fever, malaise/fatigue and weight loss.  HENT: Negative.  Negative for nosebleeds.   Eyes: Negative.  Negative for blurred vision, double vision and photophobia.  Respiratory: Negative.  Negative for cough and shortness of breath.   Cardiovascular: Negative.  Negative for chest pain, palpitations and leg swelling.  Gastrointestinal: Negative.  Negative for heartburn, nausea and vomiting.  Musculoskeletal: Negative.  Negative for myalgias.  Neurological: Negative.  Negative for dizziness, focal weakness, seizures and headaches.   Psychiatric/Behavioral: Negative.  Negative for suicidal ideas.     Past Medical History:  Diagnosis Date   High cholesterol    Thyroid  disease hypothyroidism    Past Surgical History:  Procedure Laterality Date   TOTAL ABDOMINAL HYSTERECTOMY      History reviewed. No pertinent family history.  Social History Reviewed with no changes to be made today.   Outpatient Medications Prior to Visit  Medication Sig Dispense Refill   cholecalciferol (VITAMIN D3) 25 MCG (1000 UNIT) tablet Take 1,000 Units by mouth daily.     levothyroxine  (SYNTHROID ) 75 MCG tablet Take 1 tablet (75 mcg total) by mouth daily. 90 tablet 3   Multiple Vitamin (MULTIVITAMIN) capsule Take 1 capsule by mouth daily.     No facility-administered medications prior to visit.    No Known Allergies     Objective:    BP 128/79 (BP Location: Left Arm, Patient Position: Sitting, Cuff Size: Normal)   Pulse 86   Resp 18   Ht 5' 2 (1.575 m)   Wt 123 lb 9.6 oz (56.1 kg)   SpO2 98%   BMI 22.61 kg/m  Wt Readings from Last 3 Encounters:  01/25/24 123 lb 9.6 oz (56.1 kg)  09/09/23 135 lb (61.2 kg)  02/18/23 138 lb (62.6 kg)    Physical Exam Vitals and nursing note reviewed.  Constitutional:      Appearance: She is well-developed.  HENT:     Head: Normocephalic and atraumatic.  Cardiovascular:     Rate  and Rhythm: Normal rate and regular rhythm.     Heart sounds: Normal heart sounds. No murmur heard.    No friction rub. No gallop.  Pulmonary:     Effort: Pulmonary effort is normal. No tachypnea or respiratory distress.     Breath sounds: Normal breath sounds. No decreased breath sounds, wheezing, rhonchi or rales.  Chest:     Chest wall: No tenderness.  Abdominal:     General: Bowel sounds are normal.     Palpations: Abdomen is soft.  Musculoskeletal:        General: Normal range of motion.     Cervical back: Normal range of motion.  Skin:    General: Skin is warm and dry.  Neurological:      Mental Status: She is alert and oriented to person, place, and time.     Coordination: Coordination normal.  Psychiatric:        Behavior: Behavior normal. Behavior is cooperative.        Thought Content: Thought content normal.        Judgment: Judgment normal.          Patient has been counseled extensively about nutrition and exercise as well as the importance of adherence with medications and regular follow-up. The patient was given clear instructions to go to ER or return to medical center if symptoms don't improve, worsen or new problems develop. The patient verbalized understanding.   Follow-up: Return if symptoms worsen or fail to improve.   Collins Dean, FNP-BC [facility] [place], [place] [phone]   01/25/2024, 3:20 PM

## 2024-01-26 LAB — CBC WITH DIFFERENTIAL/PLATELET
Basophils Absolute: 0 10*3/uL (ref 0.0–0.2)
Basos: 0 %
EOS (ABSOLUTE): 0 10*3/uL (ref 0.0–0.4)
Eos: 1 %
Hematocrit: 45.7 % (ref 34.0–46.6)
Hemoglobin: 14.8 g/dL (ref 11.1–15.9)
Immature Grans (Abs): 0 10*3/uL (ref 0.0–0.1)
Immature Granulocytes: 0 %
Lymphocytes Absolute: 2.1 10*3/uL (ref 0.7–3.1)
Lymphs: 31 %
MCH: 31.9 pg (ref 26.6–33.0)
MCHC: 32.4 g/dL (ref 31.5–35.7)
MCV: 99 fL — ABNORMAL HIGH (ref 79–97)
Monocytes Absolute: 0.4 10*3/uL (ref 0.1–0.9)
Monocytes: 5 %
Neutrophils Absolute: 4.3 10*3/uL (ref 1.4–7.0)
Neutrophils: 63 %
Platelets: 238 10*3/uL (ref 150–450)
RBC: 4.64 x10E6/uL (ref 3.77–5.28)
RDW: 13.1 % (ref 11.7–15.4)
WBC: 6.9 10*3/uL (ref 3.4–10.8)

## 2024-01-26 LAB — URINALYSIS, COMPLETE
Bilirubin, UA: NEGATIVE
Glucose, UA: NEGATIVE
Ketones, UA: NEGATIVE
Leukocytes,UA: NEGATIVE
Nitrite, UA: NEGATIVE
Protein,UA: NEGATIVE
Specific Gravity, UA: 1.013 (ref 1.005–1.030)
Urobilinogen, Ur: 1 mg/dL (ref 0.2–1.0)
pH, UA: 6.5 (ref 5.0–7.5)

## 2024-01-26 LAB — CMP14+EGFR
ALT: 24 IU/L (ref 0–32)
AST: 21 IU/L (ref 0–40)
Albumin: 4.6 g/dL (ref 3.9–4.9)
Alkaline Phosphatase: 96 IU/L (ref 44–121)
BUN/Creatinine Ratio: 15 (ref 12–28)
BUN: 9 mg/dL (ref 8–27)
Bilirubin Total: 0.6 mg/dL (ref 0.0–1.2)
CO2: 22 mmol/L (ref 20–29)
Calcium: 9.1 mg/dL (ref 8.7–10.3)
Chloride: 104 mmol/L (ref 96–106)
Creatinine, Ser: 0.61 mg/dL (ref 0.57–1.00)
Globulin, Total: 2.2 g/dL (ref 1.5–4.5)
Glucose: 123 mg/dL — ABNORMAL HIGH (ref 70–99)
Potassium: 4.2 mmol/L (ref 3.5–5.2)
Sodium: 142 mmol/L (ref 134–144)
Total Protein: 6.8 g/dL (ref 6.0–8.5)
eGFR: 102 mL/min/{1.73_m2} (ref >59)

## 2024-01-26 LAB — MICROSCOPIC EXAMINATION
Bacteria, UA: NONE SEEN
Casts: NONE SEEN /LPF
WBC, UA: NONE SEEN /HPF (ref 0–5)

## 2024-01-26 LAB — H. PYLORI BREATH TEST: H pylori Breath Test: NEGATIVE

## 2024-01-26 LAB — H. PYLORI BREATH COLLECTION

## 2024-01-30 ENCOUNTER — Other Ambulatory Visit: Payer: Self-pay | Admitting: Nurse Practitioner

## 2024-01-30 ENCOUNTER — Ambulatory Visit: Payer: Self-pay | Admitting: Nurse Practitioner

## 2024-01-30 DIAGNOSIS — R319 Hematuria, unspecified: Secondary | ICD-10-CM

## 2024-01-31 ENCOUNTER — Telehealth: Payer: Self-pay | Admitting: Internal Medicine

## 2024-01-31 NOTE — Telephone Encounter (Signed)
 Copied from CRM [id number]. Topic: General - Other >> Jan 31, 2024 12:07 PM Alysia Jumbo S wrote:  Reason for CRM: Pansy with [facility] requesting a callback regarding imaging order.  Callback # [phone]

## 2024-02-01 NOTE — Telephone Encounter (Signed)
 Let caller know that the CAT scan was ordered to check for stones and also to look at the lining of the bladder wall for any masses. Please let me know if order still needs to be changed to CAT abdomen and pelvis without contrast.  I tried calling her back but got VM.

## 2024-02-01 NOTE — Telephone Encounter (Signed)
 Called & spoke to Natasha Roberts. Natasha Roberts stated that results show kidney stones and needs the order to be changed. The order needs to be changed to abdomen / pelvis without contrast. Currently it stated W  WO and needs further clarification.

## 2024-02-01 NOTE — Telephone Encounter (Signed)
 I called and spoke with one of the radiologist about this study.  I told him it was being ordered for persistent microscopic hematuria.  He states that it should be CAT scan of the abdomen and pelvis with and without contrast hematuria protocol.  I then returned call to Pansy at [facility] to let her know this and that we will be keeping the current order.  Left her a VMM to call back if any questions.

## 2024-02-20 ENCOUNTER — Encounter: Payer: Self-pay | Admitting: Nurse Practitioner

## 2024-02-21 ENCOUNTER — Encounter: Payer: Self-pay | Admitting: Nurse Practitioner

## 2024-02-23 ENCOUNTER — Ambulatory Visit
Admission: RE | Admit: 2024-02-23 | Discharge: 2024-02-23 | Disposition: A | Discharge status: Home / Self Care | Source: Ambulatory Visit | Attending: Nurse Practitioner | Admitting: Nurse Practitioner

## 2024-02-23 DIAGNOSIS — R319 Hematuria, unspecified: Secondary | ICD-10-CM

## 2024-02-23 MED ORDER — IOPAMIDOL (ISOVUE-300) INJECTION 61%
125.0000 mL | Freq: Once | INTRAVENOUS | Status: AC | PRN
  Administered 2024-02-23: 125 mL via INTRAVENOUS

## 2024-02-27 ENCOUNTER — Ambulatory Visit: Payer: Self-pay | Admitting: Nurse Practitioner

## 2024-09-01 ENCOUNTER — Other Ambulatory Visit: Payer: Self-pay | Admitting: Internal Medicine

## 2024-09-12 ENCOUNTER — Telehealth: Payer: Self-pay

## 2024-09-12 ENCOUNTER — Ambulatory Visit: Payer: Commercial Managed Care - HMO | Admitting: Internal Medicine

## 2024-09-12 NOTE — Telephone Encounter (Signed)
 Patient called and left message stating her appt was supposed to be virtual, however after checking schedule and with MD, patient needs to be in person. Patient called and made aware. Patient stated an understanding.

## 2024-09-17 ENCOUNTER — Telehealth: Admitting: Internal Medicine

## 2024-12-05 ENCOUNTER — Ambulatory Visit: Admitting: Internal Medicine
